# Patient Record
Sex: Male | Born: 1960 | Race: Black or African American | Hispanic: No | Marital: Single | Smoking: Former smoker
Health system: Southern US, Community
[De-identification: ages and names within clinical notes are randomized; demographics above are authoritative.]

## PROBLEM LIST (undated history)

## (undated) DIAGNOSIS — J410 Simple chronic bronchitis: Secondary | ICD-10-CM

## (undated) DIAGNOSIS — E785 Hyperlipidemia, unspecified: Secondary | ICD-10-CM

## (undated) DIAGNOSIS — R0602 Shortness of breath: Secondary | ICD-10-CM

## (undated) DIAGNOSIS — Z95810 Presence of automatic (implantable) cardiac defibrillator: Secondary | ICD-10-CM

## (undated) DIAGNOSIS — I219 Acute myocardial infarction, unspecified: Secondary | ICD-10-CM

## (undated) DIAGNOSIS — I10 Essential (primary) hypertension: Secondary | ICD-10-CM

## (undated) DIAGNOSIS — E119 Type 2 diabetes mellitus without complications: Secondary | ICD-10-CM

## (undated) DIAGNOSIS — J42 Unspecified chronic bronchitis: Secondary | ICD-10-CM

## (undated) DIAGNOSIS — J189 Pneumonia, unspecified organism: Secondary | ICD-10-CM

## (undated) DIAGNOSIS — M109 Gout, unspecified: Secondary | ICD-10-CM

## (undated) HISTORY — PX: TOE SURGERY: SHX1073

## (undated) HISTORY — PX: CARDIAC CATHETERIZATION: SHX172

## (undated) HISTORY — DX: Simple chronic bronchitis: J41.0

---

## 2001-01-30 ENCOUNTER — Inpatient Hospital Stay (HOSPITAL_COMMUNITY): Admission: EM | Admit: 2001-01-30 | Discharge: 2001-02-01 | Payer: Self-pay | Admitting: Internal Medicine

## 2001-02-25 ENCOUNTER — Inpatient Hospital Stay (HOSPITAL_COMMUNITY): Admission: EM | Admit: 2001-02-25 | Discharge: 2001-03-03 | Payer: Self-pay | Admitting: Emergency Medicine

## 2007-11-20 ENCOUNTER — Emergency Department (HOSPITAL_COMMUNITY): Admission: EM | Admit: 2007-11-20 | Discharge: 2007-11-20 | Payer: Self-pay | Admitting: Emergency Medicine

## 2009-05-01 ENCOUNTER — Emergency Department (HOSPITAL_COMMUNITY): Admission: EM | Admit: 2009-05-01 | Discharge: 2009-05-01 | Payer: Self-pay | Admitting: Emergency Medicine

## 2009-06-06 ENCOUNTER — Ambulatory Visit: Payer: Self-pay | Admitting: Vascular Surgery

## 2010-08-29 LAB — CBC
MCHC: 33.7 g/dL (ref 30.0–36.0)
RBC: 4.93 MIL/uL (ref 4.22–5.81)

## 2010-08-29 LAB — COMPREHENSIVE METABOLIC PANEL
ALT: 19 U/L (ref 0–53)
Albumin: 3.9 g/dL (ref 3.5–5.2)
Alkaline Phosphatase: 83 U/L (ref 39–117)
Chloride: 104 mEq/L (ref 96–112)
Glucose, Bld: 84 mg/dL (ref 70–99)
Potassium: 4.1 mEq/L (ref 3.5–5.1)
Sodium: 137 mEq/L (ref 135–145)
Total Bilirubin: 1 mg/dL (ref 0.3–1.2)
Total Protein: 7.6 g/dL (ref 6.0–8.3)

## 2010-08-29 LAB — DIFFERENTIAL
Basophils Relative: 1 % (ref 0–1)
Eosinophils Relative: 2 % (ref 0–5)
Lymphs Abs: 3 10*3/uL (ref 0.7–4.0)
Monocytes Absolute: 0.8 10*3/uL (ref 0.1–1.0)

## 2010-08-29 LAB — URINALYSIS, ROUTINE W REFLEX MICROSCOPIC
Glucose, UA: NEGATIVE mg/dL
Hgb urine dipstick: NEGATIVE
Ketones, ur: 15 mg/dL — AB
pH: 5.5 (ref 5.0–8.0)

## 2010-10-13 NOTE — Discharge Summary (Signed)
[place]. [facility]  Patient:    JAXDEN, BLYDEN Visit Number: [id number] MRN: [id number]          Service Type: MED Location: 2000 2018 01 Attending Physician:  Lewayne Bunting Dictated by:   Lavella Hammock, P.A. Admit Date:  01/30/2001 Discharge Date: 02/01/2001   CC:         [facility] in [place], [place]  Dr. Tomie China in [place], [place]   Referring Physician Discharge [place]  DATE OF BIRTH:  Aug 02, 1960  PROCEDURES: 1. Cardiac catheterization. 2. Coronary arteriogram. 3. Left ventriculogram. 4. PTCA and stent of one vessel.  HOSPITAL COURSE:  Mr. Phillips is a 50 year old male with a history of MI and percutaneous intervention seven months ago who went to [facility] emergency room on the night of admission for chest pain.  Patient states that he used cocaine on January 27, 2001 and developed substernal chest pain on the night before admission.  He presented to [facility] emergency room and had ST elevations with initially positive CK-MB and troponins.  He received aspirin, heparin, and nitroglycerin which caused hypotension but his pain was decreased.  He was transferred to [facility] for further evaluation and catheterization.  He went to the catheterization laboratory on January 30, 2001 and it showed a normal left main, an LAD with a 20% mid lesion, and a circumflex with a 70% lesion.  The RCA had a 40% mid lesion and then was totaled in the proximal portion.  The distal RCA had 30% and 50% stenosis.  He received PTCA and stent to the RCA, reducing the stenosis to 0 with TIMI 3 flow.  His EF was 45% with severe inferior akinesis and trace MR.  He had hypotension in response to nitroglycerin so this was discontinued. Post procedure he was otherwise stable.  He had a lipid profile checked and it showed a total cholesterol 294 with triglycerides 200, HDL 52, LDL 202.  He was started on Zocor at 40.  The patient was seen  by cardiac rehab and did well.  He was ambulatory without chest pain or shortness of breath.  Patient was counselled on cocaine and tobacco use and stated he would quit both of these but refused any outside help or assistance.  The patient had no way of getting medications filled so the hospital was providing short-term help with this and the patient is set up to go to the [facility] in [place].  The patient is considered stable for discharge on February 01, 2001.  DISCHARGE CONDITION:  Stable.  CONSULTS:  None.  COMPLICATIONS:  None.  DISCHARGE DIAGNOSES: 1. Acute inferior myocardial infarction with percutaneous transluminal    coronary angioplasty and stent to the right coronary artery this admission. 2. Family history of coronary artery disease. 3. History of tobacco use. 4. History of cocaine use. 5. Hypertension. 6. Left ventricular dysfunction with an ejection fraction of 45% by    catheterization this admission. 7. Hyperlipidemia.  DISCHARGE INSTRUCTIONS:  ACTIVITY:  His activity level is to include no driving for a week and no sexual or strenuous activity until cleared by M.D.  DIET:  He is to stick to a low fat diet.  WOUND CARE:  He is to call the office for bleeding, swelling, or drainage at the catheterization site.  SPECIAL INSTRUCTIONS:  He is not to use cocaine, tobacco, or alcohol.  FOLLOW-UP:  He is to follow up with Dr. Tomie China and see him  in two weeks.  He is to follow up at the [facility] in [place].  DISCHARGE MEDICATIONS: 1. Coated aspirin 325 mg q.d. 2. Plavix 75 mg q.d. for a month. 3. Altace 2.5 mg b.i.d. 4. Zocor 40 mg q.d. 5. Nitroglycerin 0.4 mg sublingual p.r.n. Dictated by:   Lavella Hammock, P.A. Attending Physician:  Lewayne Bunting DD:  02/01/01 TD:  02/01/01 Job: [id number]

## 2010-10-13 NOTE — Cardiovascular Report (Signed)
[place]. [facility]  Patient:    Austin [name], Austin [name] Visit Number: [id number] MRN: [id number]          Service Type: MED Location: [phone] Attending Physician:  Talitha Givens Dictated by:   Veneda Melter, M.D., [facility] Proc. Date: 03/03/01 Admit Date:  02/25/2001 Discharge Date: 03/03/2001   CC:         Glean Hess R. Revankar, M.D., Lysbeth Galas W. Ladona Ridgel, M.D. [facility]  Daisey Must, M.D. [facility]  Maisie Fus D. Riley Kill, M.D. [facility]   Cardiac Catheterization  PROCEDURE: 1. Left heart catheterization 2. Selective coronary angiography.  DIAGNOSES: 1. Two-vessel coronary artery disease. 2. Patent stents in the right coronary artery.  CARDIOLOGIST:  Veneda Melter, M.D.  HISTORY:  Austin [name] is a 50 year old black gentleman who suffered an inferior wall myocardial infarction on January 30, 2001, treated acutely with percutaneous intervention of the right coronary artery.  Unfortunately, the patient suffered reocclusion of the right coronary artery on February 25, 2001, requiring urgent repeat intervention.  He has been stabilized medically and presents now for relook angiography to assess for residual thrombus.  TECHNIQUE:  After informed consent was obtained, the patient was brought to the catheterization lab.  A 6-French sheath was placed in the right femoral artery.  Left heart catheterization and selective coronary angiography were then performed in the usual fashion using preformed 6-French Judkins catheters.  Left ventriculogram as not performed.  At the termination of the case, catheters and sheaths were removed and manual pressure applied until adequate hemostasis was achieved.  The patient tolerated the procedure well and was transferred to the floor in stable condition.  FINDINGS: 1. Left main trunk: A large caliber vessel with luminal irregularities. 2. LAD.  This is a large caliber vessel that provides a major first diagonal    branch in the  proximal segment.  The LAD system has mild irregularities    not greater than 20 to 30%. 3. Left circumflex artery: This is a medium caliber vessel that provides a    small first marginal branch at the proximal segment, a medium caliber    second marginal branch in the mid section, and a larger third marginal    branch distally.  The left circumflex system has a moderate stenosis of    60% in the mid A-V segment between the first and second marginal branches.    The remainder of the left circumflex system has mild irregularities. 4. Right coronary artery is dominant.  This is a medium caliber vessel that    provides a posterior descending artery and and a posterior ventricular    branch in its terminal segment.   The right coronary artery has    evidence of stent placement from the proximal segment to the mid section    of the vessel after the RV marginal branch.  These are all widely patent    with only mild irregularities in the mid RCA prior to the RV marginal    branch of 10%.  The distal vasculature has mild irregularities of 20 to    30%.  There is TIMI-3 flow in the right coronary artery. 5. LV: Pressure is as follows, 113/5, aortic was 113/80.  LVEDP equals 10.  ASSESSMENT/PLAN:  Austin [name] is a 50 year old gentleman who is status post inferior wall myocardial infarction with recurrent thrombosis.  The patient has patent infarct related vessel and moderate disease of the left circumflex system.  Continued medical therapy will be pursued. Dictated  by:   Veneda Melter, M.D., [facility] Attending Physician:  Talitha Givens DD:  03/03/01 TD:  03/03/01 Job: [id number]

## 2010-10-13 NOTE — Discharge Summary (Signed)
[place]. [facility]  Patient:    Austin [name], Austin [name] Visit Number: [id number] MRN: [id number]          Service Type: MED Location: [phone] Attending Physician:  Austin [name] Dictated by:   Austin [name], P.A.-C. Admit Date:  02/25/2001 Discharge Date: 03/03/2001   CC:         Austin Dubin. [name], M.D., [place], [place]   Discharge Summary  DATE OF BIRTH: 09-28-1960  HISTORY OF PRESENT ILLNESS: Austin [name] is a 50 year old black male, who presented with severe chest discomfort radiating into his back, associated with chest discomfort.  This began at 1 a.m.  It is noted that he had used cocaine at 6 p.m. the prior evening.  He tried taking nitroglycerin and his cardiac medications without relief.  He presented to [facility], where an EKG showed changes indicative of inferior myocardial infarction.  He was given Lovenox, IV nitroglycerin, aspirin, and IV Cardizem.  The patient was still having chest discomfort at the time of transfer.  PAST MEDICAL HISTORY:  1. Inferior MI, with stenting to the RCA on January 30, 2001 post cocaine     use.  2. History of hypertension.  3. Hyperlipidemia.  4. Obesity.  5. Cocaine use.  SOCIAL HISTORY: He states that he quit alcohol and smokes one pack per day.  LABORATORY DATA: At [facility] CK total was 767 with MB of 110, relative index 14.4.  Troponin 8.34.  A second CK was 968, MB 107.5, relative index 11.1, and troponin 14.73.  Subsequent CKs and troponins were declining.  Admission sodium was 137, potassium 3.6, BUN 20, creatinine 1.4, glucose 158.  H&H 12.6, 37.2, normal indices; platelets 247,000; WBC 7.8.  EKG on transfer showed normal sinus rhythm, development of inferior Q waves and inferior ST segment elevation and T wave inversion.  Subsequent EKGs essentially showed the same, with increase in Q waves.  Chest x-ray from [facility] is not on the  chart.  HOSPITAL COURSE: Austin [name] accepted Austin [name] in transfer.  He was placed on IV Integrelin, aspirin, and IV nitroglycerin.  Austin [name] felt that he should go to drug rehab post cardiac care.  He has had cocaine MI.  He was taken to the catheterization laboratory on February 25, 2001 by Dr. Riley [name].  According to Austin [name] note he had 100% proximal RCA at the prior stent site with a residual thrombus post procedure.  Angioplasty and a new stent was placed in the RCA without difficulty.  It is noted he had inferior akinesis and a 75% circumflex as well.  Dr. Riley [name] noted that he is at very high risk for re-occlusion.  He was maintained on heparin overnight, and felt he should have Integrelin.  He was maintained on heparin for another 48 hours and Integrelin overnight.  Post sheath removal and bed rest he was ambulating without difficulty.  Medications were adjusted.  Care management saw the patient and noted that he could not use the Z fund for medication because he had used that on his prior visit and that he utilizes [facility] and [facility] to assist with medications.  The patient was unreceptive to receiving info on substance abuse options on prior admission and still unreceptive at this admission.  We shall discuss with him contacting [facility] and phone numbers were provided.  Over the next several days medications were adjusted.  The patient complained of leg  discomfort on February 26, 2001 and Dr. Riley [name] ordered lower extremity Dopplers, which were negative for DVT.  On March 03, 2001 he underwent repeat cardiac catheterization for re-evaluation of his right coronary artery.  This was performed by Dr. Chales [name].  His RCA was patent with a 10% residual at the prior stent site.  He also had a 60% proximal circumflex.  Left ventriculogram was not performed.  Post sheath removal and bed rest he was ambulating without difficulty.  The  catheterization site was intact, and he was discharged home.  DISCHARGE DIAGNOSES:  1. Acute inferior myocardial infarction, associated with cocaine use, status     posterior angioplasty and stenting to restenotic right coronary artery     stent.  2. Substance abuse.  3. Noncompliance with medications.  4. History of hypertension.  5. Hyperlipidemia.  DISPOSITION: He is discharged home.  DISCHARGE MEDICATIONS:  1. Enteric-coated aspirin 325 mg q.d.  2. Plavix 75 mg q.d. for four weeks.  3. Altace 2.5 mg b.i.d.  4. Zocor 40 mg q.h.s.  5. Hydrochlorothiazide 25 mg q.d.  6. K-Dur 20 mEq q.d.  7. Sublingual nitroglycerin q.d.  He was written prescriptions for Plavix, Altace, hydrochlorothiazide, and K-Dur as he stated that he had Zocor and sublingual nitroglycerin and aspirin at home.  DISCHARGE ACTIVITY: He was advised no lifting, driving, sexual activity, heavy exertion or working until after he sees his physician.  DISCHARGE DIET: Maintain low-salt/low-fat/low-cholesterol diet.  DISCHARGE INSTRUCTIONS: He he has any problems with his catheterization site he is asked to call Austin [name] immediately.  He was told absolutely no tobacco or drug use.  FOLLOW-UP: In the morning he is asked to call Austin [name] to arrange a follow-up two week appointment. Dictated by:   Austin [name], P.A.-C. Attending Physician:  Austin [name] DD:  03/03/01 TD:  03/04/01 Job: [id number]

## 2010-10-13 NOTE — Cardiovascular Report (Signed)
[place]. [facility]  Patient:    Austin [name], Austin [name] Visit Number: [id number] MRN: [id number]          Service Type: MED Location: [facility]UA [id number] Attending Physician:  Lewayne Bunting Dictated by:   Daisey Must, M.D. [facility] Proc. Date: 01/30/01 Admit Date:  01/30/2001   CC:         Doylene Canning. Ladona Ridgel, M.D. [facility] R. Revankar, M.D.  Cardiac Catheterization Laboratory   Cardiac Catheterization  PROCEDURES PERFORMED: 1. Left heart catheterization with coronary angiography and left    ventriculography. 2. Percutaneous transluminal coronary angioplasty with AngioJet thrombectomy    followed by stent placement x2 in the proximal right coronary artery.  INDICATIONS:  The patient is a 50 year old male, with a history of cocaine abuse.  He developed the onset of chest pain approximately 9 p.m. last night. He ultimately presented to [facility] Emergency Room where ECG was consistent with an evolving inferior wall myocardial infarction and cardiac enzymes were elevated.  He was transferred emergently to [facility] for emergent cardiac catheterization.  He arrived here at approximately 7 a.m.  CATHETERIZATION PROCEDURE:  A 7 French sheath was placed in the right femoral artery, 6 French sheath in the right femoral vein.  Standard Judkins 6 French catheters were utilized.  Contrast was Omnipaque.  There were no complications.  RESULTS:  HEMODYNAMICS:  Left ventricular pressure 142/26.  Aortic pressure 142/96. There was no aortic valve gradient.  LEFT VENTRICULOGRAM:  There is severe akinesis of the inferior wall.  Ejection fraction is calculated at 45%.  There is trace mitral regurgitation.  CORONARY ARTERIOGRAPHY:  (Right dominant).  Left main:  Normal.  Left anterior descending:  The left anterior descending artery has minor luminal irregularities in the mid vessel.  The LAD gives rise to a normal sized first diagonal branch.  Left  circumflex:  The left circumflex has a tubular 70% stenosis in the mid vessel. It gives rise to a normal sized OM-1, small OM-2, and normal sized OM-3.  Right coronary artery:  The right coronary artery is a dominant vessel.  There is a 40% stenosis in the proximal vessel followed by 100% occlusion of the proximal vessel with TIMI-0 flow.  There are grade 2 left to right collaterals filling the distal right coronary artery.  IMPRESSIONS: 1. Mild to moderately decreased left ventricular systolic function secondary    to an acute inferior wall myocardial infarction. 2. Two-vessel coronary artery disease.  The culprit lesion has 100%    occlusion in the proximal right coronary artery.  There is moderate    disease in the left circumflex as described.  PLAN:  Percutaneous intervention to the right coronary artery, see below.  PERCUTANEOUS TRANSLUMINAL CORONARY ANGIOPLASTY PROCEDURE:  Following the completion of the diagnostic catheterization, we proceeded with coronary intervention.  Heparin and ReoPro were administered per protocol.  We used a 7 [place] guiding catheter.  The lesion was successfully crossed with a Hi-Torque Floppy wire and advanced to the distal vessel.  We then advanced a 3.0 x 15 mm Maverick balloon over this wire and positioned it across the lesion.  Sequential inflations were performed at 2 and then 4 atmospheres in the proximal vessel.  This did a salvage partial reperfusion into the distal vessel with evidence of significant residual thrombus.  We therefore proceeded with AngioJet thrombectomy.  Prior to proceeding with the AngioJet we placed a temporary pacemaker in the right ventricular apex.  The patient  was administered intravenous aminophylline to prevent bradycardia.  We then performed AngioJet thrombectomy with a 4 French AngioJet catheter for a total of four passes.  This resulted in removal of most of the significant visible thrombus.  We then went back  with our 3.5 x 15 mm Maverick balloon and positioned this across the lesion in the proximal vessel and inflated up to 6 atmospheres.  In the distal vessel at the acute margin was an area of haziness and a question of a filling defect.  We therefore advanced this balloon to that area and inflated it to 6 atmospheres.  We then deployed a 3.5 x 23 mm heparin-coated Bx Velocity stent in the proximal vessel at a deployment pressure of 14 atmospheres.  This stent was post-dilated with a 3.75 x 20 mm Quantum balloon inflated to 18 atmospheres in the distal stent, 20 atmospheres at the proximal edge of the stent, and then again 20 atmospheres at distal aspect of the stent.  Angiographic images at that point revealed significant improvement, however, there was a slight angiographic filling defect just proximal to the stent.  We therefore deployed a second stent.  This would not pass over the Hi-Torque Floppy wire due to angulation.  We therefore passed a Mailman wire along side the floppy wire and advanced the stent over this wire. With this, we were able to position the stent such that there was minimal overlap at the proximal edge of the previously placed stent.  The floppy wire was removed.  We then deployed this stent at a deployment pressure of 10 atmospheres.  Intermittent doses of intracoronary verapamil and nitroglycerin were administered to maintain distal perfusion.  Final angiographic images were then obtained revealing patency of the right coronary artery with 0% residual stenosis and TIMI-3 flow.  COMPLICATIONS:  None.  RESULTS:  Successful percutaneous transluminal coronary angioplasty with AngioJet thrombectomy followed by stent placement x2 in the proximal right coronary artery.  A 100% occlusion with TIMI-0 flow was reduced to 0% residual with TIMI-3 flow.   PLAN:  ReoPro will be continued for 12 hours.  Plavix will be administered for four weeks.  The patient needs aggressive  risk factor modification. Dictated by:   Daisey Must, M.D. [facility] Attending Physician:  Lewayne Bunting DD:  01/30/01 TD:  01/30/01 Job: [id number]

## 2011-05-29 DIAGNOSIS — J189 Pneumonia, unspecified organism: Secondary | ICD-10-CM

## 2011-05-29 HISTORY — DX: Pneumonia, unspecified organism: J18.9

## 2013-03-12 NOTE — H&P (Signed)
HISTORY AND PHYSICAL  Austin [name] is a 52 y.o. male patient with CC: painful teeth.  No diagnosis found.  No past medical history on file.  No current facility-administered medications for this encounter.   No current outpatient prescriptions on file.   Allergies not on file Active Problems:   * No active hospital problems. *  Vitals: There were no vitals taken for this visit. Lab results:No results found for this or any previous visit (from the past 24 hour(s)). Radiology Results: No results found. General appearance: alert, cooperative and severe distress Head: Normocephalic, without obvious abnormality, atraumatic Eyes: negative Nose: Nares normal. Septum midline. Mucosa normal. No drainage or sinus tenderness. Throat: Dental caries and periodontal disease teeth #'s 9, 10, 11, 12, 13, 14, 15, 18, 20, 21, 22, 23, 24, 25, 26, 27 Neck: no adenopathy, supple, symmetrical, trachea midline and thyroid not enlarged, symmetric, no tenderness/mass/nodules Resp: clear to auscultation bilaterally Cardio: regular rate and rhythm, S1, S2 normal, no murmur, click, rub or gallop  Assessment:52 BM HTN, Pacemaker, NIDDM, Obesity with nonrestorable teeth #'s 9, 10, 11, 12, 13, 14, 15, 18, 20, 21, 22, 23, 24, 25, 26, 27.  Plan: Extraction teeth #'s 9, 10, 11, 12, 13, 14, 15, 18, 20, 21, 22, 23, 24, 25, 26, 27, alveoloplasty. General Anesthesia. Day surgery.   Nelly Scriven M 03/12/2013

## 2013-03-13 ENCOUNTER — Encounter (HOSPITAL_COMMUNITY): Payer: Self-pay

## 2013-03-13 ENCOUNTER — Encounter (HOSPITAL_COMMUNITY)
Admission: RE | Admit: 2013-03-13 | Discharge: 2013-03-13 | Disposition: A | Discharge status: Home / Self Care | Payer: Medicaid Other | Source: Ambulatory Visit | Attending: Anesthesiology | Admitting: Anesthesiology

## 2013-03-13 ENCOUNTER — Encounter (HOSPITAL_COMMUNITY)
Admission: RE | Admit: 2013-03-13 | Discharge: 2013-03-13 | Disposition: A | Discharge status: Home / Self Care | Payer: Medicaid Other | Source: Ambulatory Visit | Attending: Oral Surgery | Admitting: Oral Surgery

## 2013-03-13 HISTORY — DX: Essential (primary) hypertension: I10

## 2013-03-13 HISTORY — DX: Presence of automatic (implantable) cardiac defibrillator: Z95.810

## 2013-03-13 HISTORY — DX: Pneumonia, unspecified organism: J18.9

## 2013-03-13 HISTORY — DX: Unspecified chronic bronchitis: J42

## 2013-03-13 HISTORY — DX: Shortness of breath: R06.02

## 2013-03-13 HISTORY — DX: Acute myocardial infarction, unspecified: I21.9

## 2013-03-13 HISTORY — DX: Gout, unspecified: M10.9

## 2013-03-13 HISTORY — DX: Hyperlipidemia, unspecified: E78.5

## 2013-03-13 HISTORY — DX: Type 2 diabetes mellitus without complications: E11.9

## 2013-03-13 LAB — BASIC METABOLIC PANEL
BUN: 25 mg/dL — ABNORMAL HIGH (ref 6–23)
CO2: 26 mEq/L (ref 19–32)
Calcium: 10.1 mg/dL (ref 8.4–10.5)
GFR calc non Af Amer: 50 mL/min — ABNORMAL LOW (ref >90)
Glucose, Bld: 178 mg/dL — ABNORMAL HIGH (ref 70–99)
Potassium: 4.8 mEq/L (ref 3.5–5.1)

## 2013-03-13 LAB — CBC
Hemoglobin: 14 g/dL (ref 13.0–17.0)
MCH: 29.5 pg (ref 26.0–34.0)
MCHC: 32.8 g/dL (ref 30.0–36.0)
MCV: 89.9 fL (ref 78.0–100.0)
RDW: 14.6 % (ref 11.5–15.5)

## 2013-03-13 NOTE — Progress Notes (Signed)
Anesthesia Chart Review:  Patient is a 52 year old male scheduled for dental extractions on 03/16/13 by Dr. Barbette Merino.  His PAT visit was on Friday 03/13/13.  History includes morbid obesity, CAD/inferior wall MI s/p PTCA, thrombectomy, and stent X 2 to RCA 01/30/01 and additional stents in 2004 in [place], ischemic cardiomyopathy, s/p single chamber Biotronik AICD (02/19/12 by Dr. Rudolpho Sevin), HTN, DM2, HLD, gout, PNA '13, chronic bronchitis, former smoker, chronic DOE. PCP is Dr. Celine Mans in [place].    Meds include allopurinol, ASA, colchicine, Advair, Lasix, hydralazine, Levemir, lisinopril, metoprolol, oxycodone, prednisone, simvastatin, spironolactone.  Primary cardiologist is Dr. Gypsy Balsam, last visit 04/03/12.  EP cardiologist is Dr. Rudolpho Sevin.  He was seen by Dr. Jerrell Mylar PA in 10/2012, and looks like his last device check was on 12/09/12.  Nuclear stress test on 11/22/11 [facility]) showed no ischemia, cardiomyopathy with EF of 22%.  Echo on 01/16/12 ([facility]) showed severely dilated LV, severe, systolic dysfunction, inferior wall severe hypokinesis, anterior wall mild hypokinesis, but apical segment severe hypokinesis.  Abnormal diastolic function (pseudonormal).  Normal RV size and function. Moderately dilated LA. Mild MR. Normal aortic root.    EKG on 03/13/13 showed NSR, inferior infarct (age undetermined).  Overall, it appears stable when compared to his EKG from [facility] on 11/18/11.  CXR on 03/13/13 showed cardiomegaly, no acute cardiopulmonary process.  Preoperative labs noted.  BUN/Cr 25/1.53.  Glucose 178.  CBC WNL.  Above reviewed with anesthesiologist Dr. Katrinka Blazing.  He did not anticipate cautery use by Dr. Barbette Merino. (Dr. Jerrell Mylar office would not sign form, but did provide the manufacturer name--Biotronik--and manufacturer number--[phone].  Form also stated that patient was not pacer dependent.) Because patient has had cardiology follow-up within the past  year, device interrogation within the past six months, stress/echo within the past two years, it is anticipated that he can proceed if no new CV/CHF symptoms.  He will be further evaluated by his assigned anesthesiologist on the day of surgery.  Velna [facility] Short Stay Center/Anesthesiology Phone [phone] 03/13/2013 4:23 PM

## 2013-03-13 NOTE — Pre-Procedure Instructions (Signed)
HAJI DELAINE  03/13/2013   Your procedure is scheduled on:  Monday March 16, 2013.  Report to [facility] Short Stay Entrance "A" at 6:30 AM.  Call this number if you have problems the morning of surgery: [phone]   Remember:   Do not eat food or drink liquids after midnight.   Take these medicines the morning of surgery with A SIP OF WATER: Allopurinol (Zyloprim), Advair inhaler, Hydralazine (Apresoline), Isosorbide (Imdur), Metoprolol (Lopressor), Oxycodone (Precocet) if needed for pain.  Do NOT take any diabetic medications the morning of your surgery    Do not wear jewelry.  Do not wear lotions, powders, or colognes. You may wear deodorant.  Men may shave face and neck.  Do not bring valuables to the hospital.  [facility] is not responsible for any belongings or valuables.               Contacts, dentures or bridgework may not be worn into surgery.  Leave suitcase in the car. After surgery it may be brought to your room.  For patients admitted to the hospital, discharge time is determined by your treatment team.               Patients discharged the day of surgery will not be allowed to drive home.  Name and phone number of your driver: Family/Friend  Special Instructions: Shower using CHG 2 nights before surgery and the night before surgery.  If you shower the day of surgery use CHG.  Use special wash - you have one bottle of CHG for all showers.  You should use approximately 1/3 of the bottle for each shower.   Please read over the following fact sheets that you were given: Pain Booklet, Coughing and Deep Breathing and Surgical Site Infection Prevention

## 2013-03-13 NOTE — Progress Notes (Addendum)
Patient informed Nurse that he had a MI in 2002 and had two stents placed. One stent was placed at [facility] and the other was placed shortly thereafter at [facility]. Patient has a ICD by [employer] and perioperative implanted cardiac device programming sheet was faxed to Dr. Sandy Salaam at [facility] [place] office # [phone] and fax [phone]. Cardiologist is Dr. Gypsy Balsam also at [facility] and LOV was this year in 2014. PCP Is Dr. Celine Mans at [facility] in [place] office # [phone] fax [phone].. Will request records. Sleep apnea results positive and will be faxed to PCP.

## 2013-03-13 NOTE — Progress Notes (Signed)
03/13/13 0959  OBSTRUCTIVE SLEEP APNEA  Have you ever been diagnosed with sleep apnea through a sleep study? No  Do you snore loudly (loud enough to be heard through closed doors)?  1  Do you often feel tired, fatigued, or sleepy during the daytime? 1  Has anyone observed you stop breathing during your sleep? 0  Do you have, or are you being treated for high blood pressure? 1  BMI more than 35 kg/m2? 1  Age over 53 years old? 1  Neck circumference greater than 40 cm/18 inches? 1  Gender: 1  Obstructive Sleep Apnea Score 7  Score 4 or greater  Results sent to PCP   This patient has screened at risk for sleep apnea using the STOP Bang tool during a pre-surgical visit. A score of 4 or greater is at risk for sleep apnea.

## 2013-03-15 MED ORDER — DEXTROSE 5 % IV SOLN
3.0000 g | INTRAVENOUS | Status: AC
  Administered 2013-03-16: 3 g via INTRAVENOUS
  Filled 2013-03-15: qty 3000

## 2013-03-16 ENCOUNTER — Encounter (HOSPITAL_COMMUNITY): Payer: Medicaid Other | Admitting: Vascular Surgery

## 2013-03-16 ENCOUNTER — Ambulatory Visit (HOSPITAL_COMMUNITY)
Admission: RE | Admit: 2013-03-16 | Discharge: 2013-03-16 | Disposition: A | Discharge status: Home / Self Care | Payer: Medicaid Other | Source: Ambulatory Visit | Attending: Oral Surgery | Admitting: Oral Surgery

## 2013-03-16 ENCOUNTER — Encounter (HOSPITAL_COMMUNITY)
Admission: RE | Disposition: A | Discharge status: Home / Self Care | Payer: Self-pay | Source: Ambulatory Visit | Attending: Oral Surgery

## 2013-03-16 ENCOUNTER — Encounter (HOSPITAL_COMMUNITY): Payer: Self-pay | Admitting: *Deleted

## 2013-03-16 ENCOUNTER — Ambulatory Visit (HOSPITAL_COMMUNITY): Payer: Medicaid Other | Admitting: Anesthesiology

## 2013-03-16 DIAGNOSIS — Z01810 Encounter for preprocedural cardiovascular examination: Secondary | ICD-10-CM | POA: Insufficient documentation

## 2013-03-16 DIAGNOSIS — I10 Essential (primary) hypertension: Secondary | ICD-10-CM | POA: Insufficient documentation

## 2013-03-16 DIAGNOSIS — K089 Disorder of teeth and supporting structures, unspecified: Secondary | ICD-10-CM | POA: Insufficient documentation

## 2013-03-16 DIAGNOSIS — Z950 Presence of cardiac pacemaker: Secondary | ICD-10-CM | POA: Insufficient documentation

## 2013-03-16 DIAGNOSIS — K029 Dental caries, unspecified: Secondary | ICD-10-CM

## 2013-03-16 DIAGNOSIS — K056 Periodontal disease, unspecified: Secondary | ICD-10-CM

## 2013-03-16 DIAGNOSIS — Z01812 Encounter for preprocedural laboratory examination: Secondary | ICD-10-CM | POA: Insufficient documentation

## 2013-03-16 DIAGNOSIS — E119 Type 2 diabetes mellitus without complications: Secondary | ICD-10-CM | POA: Insufficient documentation

## 2013-03-16 DIAGNOSIS — K011 Impacted teeth: Secondary | ICD-10-CM

## 2013-03-16 DIAGNOSIS — Z01818 Encounter for other preprocedural examination: Secondary | ICD-10-CM | POA: Insufficient documentation

## 2013-03-16 DIAGNOSIS — IMO0002 Reserved for concepts with insufficient information to code with codable children: Secondary | ICD-10-CM | POA: Insufficient documentation

## 2013-03-16 HISTORY — PX: MULTIPLE EXTRACTIONS WITH ALVEOLOPLASTY: SHX5342

## 2013-03-16 LAB — GLUCOSE, CAPILLARY: Glucose-Capillary: 151 mg/dL — ABNORMAL HIGH (ref 70–99)

## 2013-03-16 SURGERY — MULTIPLE EXTRACTION WITH ALVEOLOPLASTY
Anesthesia: General | Site: Mouth | Wound class: Clean Contaminated

## 2013-03-16 MED ORDER — OXYCODONE HCL 5 MG PO TABS
5.0000 mg | ORAL_TABLET | Freq: Once | ORAL | Status: AC | PRN
  Administered 2013-03-16: 5 mg via ORAL

## 2013-03-16 MED ORDER — FENTANYL CITRATE 0.05 MG/ML IJ SOLN
INTRAMUSCULAR | Status: DC | PRN
  Administered 2013-03-16: 100 ug via INTRAVENOUS

## 2013-03-16 MED ORDER — PROMETHAZINE HCL 25 MG/ML IJ SOLN: 6.2500 mg | INTRAMUSCULAR | Status: DC | PRN

## 2013-03-16 MED ORDER — AMOXICILLIN 500 MG PO CAPS: 500.0000 mg | ORAL_CAPSULE | Freq: Four times a day (QID) | ORAL | Status: DC

## 2013-03-16 MED ORDER — LACTATED RINGERS IV SOLN
INTRAVENOUS | Status: DC
  Administered 2013-03-16 (×3): via INTRAVENOUS

## 2013-03-16 MED ORDER — OXYCODONE-ACETAMINOPHEN 10-325 MG PO TABS: 1.0000 | ORAL_TABLET | ORAL | Status: DC | PRN

## 2013-03-16 MED ORDER — MIDAZOLAM HCL 5 MG/5ML IJ SOLN
INTRAMUSCULAR | Status: DC | PRN
  Administered 2013-03-16: 2 mg via INTRAVENOUS

## 2013-03-16 MED ORDER — OXYCODONE HCL 5 MG/5ML PO SOLN: 5.0000 mg | Freq: Once | ORAL | Status: AC | PRN

## 2013-03-16 MED ORDER — SODIUM CHLORIDE 0.9 % IR SOLN
Status: DC | PRN
  Administered 2013-03-16: 1000 mL

## 2013-03-16 MED ORDER — LIDOCAINE-EPINEPHRINE 2 %-1:100000 IJ SOLN
INTRAMUSCULAR | Status: DC | PRN
  Administered 2013-03-16: 19 mL

## 2013-03-16 MED ORDER — OXYCODONE HCL 5 MG PO TABS
ORAL_TABLET | ORAL | Status: AC
  Filled 2013-03-16: qty 1

## 2013-03-16 MED ORDER — HYDROMORPHONE HCL PF 1 MG/ML IJ SOLN: 0.2500 mg | INTRAMUSCULAR | Status: DC | PRN

## 2013-03-16 MED ORDER — PROPOFOL 10 MG/ML IV BOLUS
INTRAVENOUS | Status: DC | PRN
  Administered 2013-03-16: 100 mg via INTRAVENOUS

## 2013-03-16 MED ORDER — LIDOCAINE HCL (CARDIAC) 20 MG/ML IV SOLN
INTRAVENOUS | Status: DC | PRN
  Administered 2013-03-16: 100 mg via INTRAVENOUS

## 2013-03-16 MED ORDER — SUCCINYLCHOLINE CHLORIDE 20 MG/ML IJ SOLN
INTRAMUSCULAR | Status: DC | PRN
  Administered 2013-03-16: 120 mg via INTRAVENOUS

## 2013-03-16 MED ORDER — PHENYLEPHRINE HCL 10 MG/ML IJ SOLN
INTRAMUSCULAR | Status: DC | PRN
  Administered 2013-03-16 (×2): 120 ug via INTRAVENOUS

## 2013-03-16 MED ORDER — LIDOCAINE-EPINEPHRINE 2 %-1:100000 IJ SOLN
INTRAMUSCULAR | Status: AC
  Filled 2013-03-16: qty 1

## 2013-03-16 SURGICAL SUPPLY — 28 items
BUR CROSS CUT FISSURE 1.6 (BURR) ×2 IMPLANT
BUR EGG ELITE 4.0 (BURR) ×1 IMPLANT
CANISTER SUCTION 2500CC (MISCELLANEOUS) ×2 IMPLANT
CLOTH BEACON ORANGE TIMEOUT ST (SAFETY) ×1 IMPLANT
COVER SURGICAL LIGHT HANDLE (MISCELLANEOUS) ×2 IMPLANT
CRADLE DONUT ADULT HEAD (MISCELLANEOUS) ×2 IMPLANT
DECANTER SPIKE VIAL GLASS SM (MISCELLANEOUS) ×2 IMPLANT
GAUZE PACKING FOLDED 2  STR (GAUZE/BANDAGES/DRESSINGS) ×1
GAUZE PACKING FOLDED 2 STR (GAUZE/BANDAGES/DRESSINGS) ×1 IMPLANT
GLOVE BIO SURGEON STRL SZ 6.5 (GLOVE) ×2 IMPLANT
GLOVE BIO SURGEON STRL SZ7.5 (GLOVE) ×2 IMPLANT
GLOVE BIOGEL PI IND STRL 7.0 (GLOVE) ×1 IMPLANT
GLOVE BIOGEL PI INDICATOR 7.0 (GLOVE) ×1
GOWN STRL NON-REIN LRG LVL3 (GOWN DISPOSABLE) ×2 IMPLANT
GOWN STRL REIN XL XLG (GOWN DISPOSABLE) ×2 IMPLANT
KIT BASIN OR (CUSTOM PROCEDURE TRAY) ×2 IMPLANT
KIT ROOM TURNOVER OR (KITS) ×2 IMPLANT
NEEDLE 22X1 1/2 (OR ONLY) (NEEDLE) ×2 IMPLANT
NS IRRIG 1000ML POUR BTL (IV SOLUTION) ×2 IMPLANT
PAD ARMBOARD 7.5X6 YLW CONV (MISCELLANEOUS) ×4 IMPLANT
SUT CHROMIC 3 0 PS 2 (SUTURE) ×3 IMPLANT
SUT ETHILON 6 0 P 1 (SUTURE) ×1 IMPLANT
SYR CONTROL 10ML LL (SYRINGE) ×2 IMPLANT
TOWEL OR 17X26 10 PK STRL BLUE (TOWEL DISPOSABLE) ×2 IMPLANT
TRAY ENT MC OR (CUSTOM PROCEDURE TRAY) ×2 IMPLANT
TUBING IRRIGATION (MISCELLANEOUS) ×1 IMPLANT
WATER STERILE IRR 1000ML POUR (IV SOLUTION) IMPLANT
YANKAUER SUCT BULB TIP NO VENT (SUCTIONS) ×2 IMPLANT

## 2013-03-16 NOTE — Anesthesia Procedure Notes (Addendum)
Procedure Name: Intubation Date/Time: 03/16/2013 9:53 AM Performed by: Whitman Hero Pre-anesthesia Checklist: Patient identified, Timeout performed, Emergency Drugs available, Suction available and Patient being monitored Patient Re-evaluated:Patient Re-evaluated prior to inductionOxygen Delivery Method: Circle system utilized Preoxygenation: Pre-oxygenation with 100% oxygen Intubation Type: IV induction Ventilation: Mask ventilation without difficulty Laryngoscope Size: Mac and 4 Grade View: Grade II Tube type: Oral Tube size: 7.5 mm Number of attempts: 1 Airway Equipment and Method: Patient positioned with wedge pillow Secured at: 23 cm Tube secured with: Tape Dental Injury: Teeth and Oropharynx as per pre-operative assessment

## 2013-03-16 NOTE — Transfer of Care (Signed)
Immediate Anesthesia Transfer of Care Note  Patient: Austin [name]  Procedure(s) Performed: Procedure(s): MULTIPLE EXTRACTION (#9, 10, 11, 12, 13, 14, 15, 18, 20, 21, 22, 23, 24, 25, 26, 27) WITH ALVEOLOPLASTY (N/A)  Patient Location: PACU  Anesthesia Type:General  Level of Consciousness: awake and oriented  Airway & Oxygen Therapy: Patient Spontanous Breathing and Patient connected to nasal cannula oxygen  Post-op Assessment: Report given to PACU RN and Post -op Vital signs reviewed and stable  Post vital signs: Reviewed and stable  Complications: No apparent anesthesia complications

## 2013-03-16 NOTE — Progress Notes (Signed)
Report given to robin rn as caregiver 

## 2013-03-16 NOTE — Anesthesia Postprocedure Evaluation (Signed)
Anesthesia Post Note  Patient: Austin [name]  Procedure(s) Performed: Procedure(s) (LRB): MULTIPLE EXTRACTION (#9, 10, 11, 12, 13, 14, 15, 18, 20, 21, 22, 23, 24, 25, 26, 27) WITH ALVEOLOPLASTY (N/A)  Anesthesia type: general  Patient location: PACU  Post pain: Pain level controlled  Post assessment: Patient's Cardiovascular Status Stable  Last Vitals:  Filed Vitals:   03/16/13 1221  BP: 150/84  Pulse: 78  Temp:   Resp:     Post vital signs: Reviewed and stable  Level of consciousness: sedated  Complications: No apparent anesthesia complications

## 2013-03-16 NOTE — Progress Notes (Signed)
Pt having elevated BP, cuff changed to large cuff.  BP better afterwards

## 2013-03-16 NOTE — Op Note (Signed)
03/16/2013  10:38 AM  PATIENT:  [name]  52 y.o. male  PRE-OPERATIVE DIAGNOSIS:  NON-RESTORABLE TEETH #9, 10, 11, 12, 13, 14, 15, 18, 20, 21, 22, 23, 24, 25, 26, 27  POST-OPERATIVE DIAGNOSIS:  SAME  PROCEDURE:  Procedure(s): MULTIPLE EXTRACTION (#9, 10, 11, 12, 13, 14, 15, 18, 20, 21, 22, 23, 24, 25, 26, 27) WITH ALVEOLOPLASTY  SURGEON:  Surgeon(s): Georgia Lopes, DDS  ANESTHESIA:   local and general  EBL:  minimal  DRAINS: none   SPECIMEN:  No Specimen  COMPLICATIONS; 3mm laceration to left upper lip  COUNTS:  YES  PLAN OF CARE: Discharge to home after PACU  PATIENT DISPOSITION:  PACU - hemodynamically stable.   PROCEDURE DETAILS: Dictation #[id number]  Georgia Lopes, DMD 03/16/2013 10:38 AM

## 2013-03-16 NOTE — Op Note (Signed)
NAME:  Austin [name], Austin [name] NO.:  [id number]  MEDICAL RECORD NO.:  [id number]  LOCATION:  [facility]                         FACILITY:  [facility]  PHYSICIAN:  Georgia Lopes, M.D.  DATE OF BIRTH:  [birth year]  DATE OF PROCEDURE:  03/16/2013 DATE OF DISCHARGE:  03/16/2013                              OPERATIVE REPORT   PREOPERATIVE DIAGNOSIS:  Nonrestorable teeth numbers 9, 10, 11, 12, 13, 14, 15, 18, 20, 21, 22, 23, 24, 25, 26, 27.  POSTOPERATIVE DIAGNOSIS:  Nonrestorable teeth numbers 9, 10, 11, 12, 13, 14, 15, 18, 20, 21, 22, 23, 24, 25, 26, 27 plus root of tooth #16.  PROCEDURE:  Multiple extractions teeth #9, 10, 11, 12, 13, 14, 15, 16, 18 20, 21, 22, 23, 24, 25, 26, 27, alveoplasty, left maxilla and mandible.  SURGEON:  Georgia Lopes, MD  ANESTHESIA:  General, Quita Skye. Krista Blue, MD, attending nasal intubation.  INDICATIONS FOR PROCEDURE:  Austin [name] is a 52 year old male with significant past medical history of hypertension, pacemaker, diabetes, morbid obesity, possible sleep apnea, who was referred by his general dentist for removal of remaining teeth because of the need for adequate anesthesia and airway protection, as well as patient's safety was recommended that this case be done at the hospital for airway protection could be provided by intubation.  DESCRIPTION OF PROCEDURE:  The patient was taken to the operating room, placed on the table in supine position.  General anesthesia was administered intravenously and an oral endotracheal tube was placed and marked.  The eyes were protected.  The patient was draped for the procedure.  A time-out was performed.  The posterior pharynx was suctioned.  A throat pack was placed.  A 2% lidocaine with 1:100,000 epinephrine was infiltrated in inferior alveolar block on the right and left side and buccal and palatal infiltration in the maxilla, total of 19 mL was utilized.  A 15 blade was used make a full-thickness  incision beginning in the left mandible with tooth #18 and carried forward to tooth #27, both buccally and lingually in the mandible.  Then in the maxilla, a 15 blade was used to make an incision at tooth #15 and carried forward to tooth #9, both bucally and palatally.  The periosteum was then reflected in the maxilla and mandible, and the left side was operated first on the lower aspect.  Tooth #18 was elevated with a 301 elevator and removed with the Universal forceps.  Tooth #20 was a simple extraction removed with the Asch forceps.  Tooth #21 was the residual root which required removal of bone with the Stryker handpiece under irrigation with a 701 fissure bur.  Teeth numbers 22, 23, 24, 25, 25, 26, and 27, all were simple extractions and were performed with the 301 elevator and the Asch forceps.  The lower sockets were then curetted and irrigated.  The periosteum was reflected to expose the alveolar crest and then the bone bur was used to perform the alveoplasty.  The bone file was also used for the alveoplasty in the left and right mandible. Then, the area was irrigated and closed with 3-0 chromic.  Attention was turned to the left maxilla.  The teeth were elevated with a 301 elevator and then removed from the mouth with the upper universal forceps.  Tooth #14 fractured leaving a palatal root.  Tooth #11 was complete bony impacted and required additional bone removal for removal of this tooth. When the tooth was removed, the alveolar plate fractured and was recontoured and placed back in the mouth.  Then it was noted that there was a root fragment in the area of tooth #16.  Bone was removed from around this and then the tooth was removed with the rongeur.  The periosteum was further reflected to expose the alveolar crest and then the alveoplasty was performed.  Bone shavings and bone removed from sharp inner proximal areas were replaced in the maxilla to help provide adequate bone  support, so that a denture could be stable.  The bone graft having been done, then the area was closed with 3-0 chromic continuous interlocking sutures.  It was noted at this point that there was a 3-mm laceration at the vermilion border of the upper lip.  This was closed with one 6-0 nylon.  The endotracheal tube was then repositioned to the other side of the mouth and there was no additional work to be done on the right side.  The oral cavity was then irrigated, suctioned.  Throat pack was removed.  The patient was awakened, extubated, taken to the recovery room, breathing spontaneously good condition.  ESTIMATED BLOOD LOSS:  Minimum.  COMPLICATIONS:  Small laceration, left lip sutured primarily.  SPECIMENS:  None.     Georgia Lopes, M.D.     SMJ/MEDQ  D:  03/16/2013  T:  03/16/2013  Job:  [id number]

## 2013-03-16 NOTE — H&P (Signed)
H&P documentation  -History and Physical Reviewed  -Patient has been re-examined  -No change in the plan of care  Austin [name] M  

## 2013-03-16 NOTE — Anesthesia Preprocedure Evaluation (Addendum)
Anesthesia Evaluation  Patient identified by MRN, date of birth, ID band Patient awake    Reviewed: Allergy & Precautions, H&P , NPO status , Patient's Chart, lab work & pertinent test results  History of Anesthesia Complications Negative for: history of anesthetic complications  Airway       Dental   Pulmonary shortness of breath, pneumonia -, resolved, former smoker,          Cardiovascular hypertension, Pt. on medications and Pt. 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  Echo on 01/16/12 ([facility]) showed severely dilated LV, severe, systolic dysfunction, inferior wall severe hypokinesis, anterior wall mild hypokinesis, but apical segment severe hypokinesis.  Abnormal diastolic function (pseudonormal).  Normal RV size and function. Moderately dilated LA. Mild MR. Normal aortic root.      Neuro/Psych negative neurological ROS     GI/Hepatic negative GI ROS, Neg liver ROS,   Endo/Other  diabetes, Insulin Dependent and Oral Hypoglycemic AgentsMorbid obesity  Renal/GU negative Renal ROS     Musculoskeletal   Abdominal   Peds  Hematology   Anesthesia Other Findings   Reproductive/Obstetrics                          Anesthesia Physical Anesthesia Plan  ASA: III  Anesthesia Plan: General   Post-op Pain Management:    Induction: Intravenous  Airway Management Planned: Oral ETT  Additional Equipment:   Intra-op Plan:   Post-operative Plan: Extubation in OR  Informed Consent: I have reviewed the patients History and Physical, chart, labs and discussed the procedure including the risks, benefits and alternatives for the proposed anesthesia with the patient or authorized representative who has indicated his/her understanding and acceptance.   Dental advisory given  Plan  Discussed with: CRNA, Anesthesiologist and Surgeon  Anesthesia Plan Comments:        Anesthesia Quick Evaluation

## 2013-03-17 ENCOUNTER — Encounter (HOSPITAL_COMMUNITY): Payer: Self-pay | Admitting: Oral Surgery

## 2013-05-05 ENCOUNTER — Emergency Department (HOSPITAL_COMMUNITY)
Admission: EM | Admit: 2013-05-05 | Discharge: 2013-05-05 | Disposition: A | Discharge status: Home / Self Care | Payer: Medicaid Other | Attending: Emergency Medicine | Admitting: Emergency Medicine

## 2013-05-05 DIAGNOSIS — M5431 Sciatica, right side: Secondary | ICD-10-CM

## 2013-05-05 DIAGNOSIS — Z8709 Personal history of other diseases of the respiratory system: Secondary | ICD-10-CM | POA: Insufficient documentation

## 2013-05-05 DIAGNOSIS — Z8701 Personal history of pneumonia (recurrent): Secondary | ICD-10-CM | POA: Insufficient documentation

## 2013-05-05 DIAGNOSIS — Z95810 Presence of automatic (implantable) cardiac defibrillator: Secondary | ICD-10-CM | POA: Insufficient documentation

## 2013-05-05 DIAGNOSIS — M109 Gout, unspecified: Secondary | ICD-10-CM | POA: Insufficient documentation

## 2013-05-05 DIAGNOSIS — Z79899 Other long term (current) drug therapy: Secondary | ICD-10-CM | POA: Insufficient documentation

## 2013-05-05 DIAGNOSIS — E119 Type 2 diabetes mellitus without complications: Secondary | ICD-10-CM | POA: Insufficient documentation

## 2013-05-05 DIAGNOSIS — IMO0002 Reserved for concepts with insufficient information to code with codable children: Secondary | ICD-10-CM | POA: Insufficient documentation

## 2013-05-05 DIAGNOSIS — Z7982 Long term (current) use of aspirin: Secondary | ICD-10-CM | POA: Insufficient documentation

## 2013-05-05 DIAGNOSIS — I10 Essential (primary) hypertension: Secondary | ICD-10-CM | POA: Insufficient documentation

## 2013-05-05 DIAGNOSIS — I252 Old myocardial infarction: Secondary | ICD-10-CM | POA: Insufficient documentation

## 2013-05-05 DIAGNOSIS — Z794 Long term (current) use of insulin: Secondary | ICD-10-CM | POA: Insufficient documentation

## 2013-05-05 DIAGNOSIS — Z95818 Presence of other cardiac implants and grafts: Secondary | ICD-10-CM | POA: Insufficient documentation

## 2013-05-05 DIAGNOSIS — M5430 Sciatica, unspecified side: Secondary | ICD-10-CM | POA: Insufficient documentation

## 2013-05-05 DIAGNOSIS — E785 Hyperlipidemia, unspecified: Secondary | ICD-10-CM | POA: Insufficient documentation

## 2013-05-05 DIAGNOSIS — Z87891 Personal history of nicotine dependence: Secondary | ICD-10-CM | POA: Insufficient documentation

## 2013-05-05 MED ORDER — MORPHINE SULFATE 4 MG/ML IJ SOLN
4.0000 mg | Freq: Once | INTRAMUSCULAR | Status: AC
  Administered 2013-05-05: 4 mg via INTRAMUSCULAR
  Filled 2013-05-05: qty 1

## 2013-05-05 NOTE — ED Notes (Signed)
PT ambulated with baseline gait; VSS; A&Ox3; no signs of distress; respirations even and unlabored; skin warm and dry; no questions upon discharge.  

## 2013-05-05 NOTE — ED Notes (Signed)
lower back pain that radiates into his rt. Thigh. Denies any injuries.  Pt. Was at [facility] ,  Medication is not helping.

## 2013-05-05 NOTE — ED Provider Notes (Signed)
CSN: [id number]     Arrival date & time 05/05/13  1745 History   First MD Initiated Contact with Patient 05/05/13 1953     Chief Complaint  Patient presents with  . Back Pain   (Consider location/radiation/quality/duration/timing/severity/associated sxs/prior Treatment) HPI Pt is a 52yo male with hx of DM, gout, HTN, MI with internal defibrillator c/o right lower back pain that radiates into right lateral thigh.  Pt states pain started on Saturday, 12/6.  Pain is constant, aching and sharp, 10/10, worse with activity. Denies recent falls or other trauma. Denies numbness or tingling in legs or groin.  Pt was seen at [facility] on Saturday for same where a CT abdomen was performed.  Pt was given percocet, prednisone, and flexeril but pt states he has had not relief with medication. Denies fever, n/v/d. Denies hx of cancer or IVDU.  Past Medical History  Diagnosis Date  . Automatic implantable cardioverter-defibrillator in situ   . Hypertension   . Myocardial infarction   . Shortness of breath     with exertion  . Pneumonia 2013  . Diabetes mellitus without complication     takes insulin  . Hyperlipemia   . Gout   . Chronic bronchitis     Take advair inhaler   Past Surgical History  Procedure Laterality Date  . Cardiac catheterization      stent x 2  . Toe surgery Right     pinky toe  . Multiple extractions with alveoloplasty N/A 03/16/2013    Procedure: MULTIPLE EXTRACTION (#9, 10, 11, 12, 13, 14, 15, 18, 20, 21, 22, 23, 24, 25, 26, 27) WITH ALVEOLOPLASTY;  Surgeon: Georgia Lopes, DDS;  Location: [facility] OR;  Service: Oral Surgery;  Laterality: N/A;   No family history on file. History  Substance Use Topics  . Smoking status: Former Games developer  . Smokeless tobacco: Not on file  . Alcohol Use: 1.2 oz/week    2 Cans of beer per week     Comment: occasional    Review of Systems  Constitutional: Negative for fever and chills.  Respiratory: Negative for shortness of breath.     Cardiovascular: Negative for chest pain.  Gastrointestinal: Negative for nausea, vomiting, abdominal pain and diarrhea.  Genitourinary: Negative for dysuria, flank pain, decreased urine volume and difficulty urinating.  Musculoskeletal: Positive for back pain and myalgias. Negative for neck pain and neck stiffness.  All other systems reviewed and are negative.    Allergies  Review of patient's allergies indicates no known allergies.  Home Medications   Current Outpatient Rx  Name  Route  Sig  Dispense  Refill  . allopurinol (ZYLOPRIM) 300 MG tablet   Oral   Take 300 mg by mouth daily.         Marland Kitchen aspirin 325 MG tablet   Oral   Take 325 mg by mouth daily.         . colchicine (COLCRYS) 0.6 MG tablet   Oral   Take 0.6 mg by mouth See admin instructions. Take two tablets for 1 dose, then 1 tablet 1 hour later for acute flare. Max 3 per day         . cyclobenzaprine (FLEXERIL) 10 MG tablet   Oral   Take 10 mg by mouth 3 (three) times daily as needed for muscle spasms.         . Fluticasone-Salmeterol (ADVAIR) 100-50 MCG/DOSE AEPB   Inhalation   Inhale 1 puff into the lungs every 12 (twelve)  hours.         . furosemide (LASIX) 20 MG tablet   Oral   Take 20 mg by mouth daily.         . hydrALAZINE (APRESOLINE) 25 MG tablet   Oral   Take 37.5 mg by mouth 3 (three) times daily.         . insulin detemir (LEVEMIR) 100 UNIT/ML injection   Subcutaneous   Inject 10 Units into the skin daily.         . isosorbide mononitrate (IMDUR) 30 MG 24 hr tablet   Oral   Take 30 mg by mouth daily.         Marland Kitchen lisinopril (PRINIVIL,ZESTRIL) 20 MG tablet   Oral   Take 20 mg by mouth daily.         . metoprolol (LOPRESSOR) 100 MG tablet   Oral   Take 100 mg by mouth daily.         Marland Kitchen oxyCODONE-acetaminophen (PERCOCET) 10-325 MG per tablet   Oral   Take 1-2 tablets by mouth every 4 (four) hours as needed for pain.   40 tablet   0   . predniSONE (DELTASONE) 20 MG  tablet   Oral   Take 40 mg by mouth daily.         . simvastatin (ZOCOR) 40 MG tablet   Oral   Take 40 mg by mouth daily.         Marland Kitchen spironolactone (ALDACTONE) 25 MG tablet   Oral   Take 25 mg by mouth daily.          BP 157/88  Pulse 75  Temp(Src) 98.2 F (36.8 C) (Oral)  Resp 16  Ht 5\' 11"  (1.803 m)  Wt 295 lb (133.811 kg)  BMI 41.16 kg/m2  SpO2 99% Physical Exam  Nursing note and vitals reviewed. Constitutional: He is oriented to person, place, and time. He appears well-developed and well-nourished.  Morbidly obese male lying on his left side, NAD.  HENT:  Head: Normocephalic and atraumatic.  Eyes: Conjunctivae are normal. No scleral icterus.  Neck: Normal range of motion.  Cardiovascular: Normal rate, regular rhythm and normal heart sounds.   Pulmonary/Chest: Effort normal and breath sounds normal. No respiratory distress. He has no wheezes. He has no rales. He exhibits no tenderness.  Abdominal: Soft. Bowel sounds are normal. He exhibits no distension and no mass. There is no tenderness. There is no rebound and no guarding.  Musculoskeletal: Normal range of motion. He exhibits tenderness ( right lumbar musculature, right buttock and right lateral thigh). He exhibits no edema.       Back:       Legs: Neurological: He is alert and oriented to person, place, and time.  Pt able to ambulate w/o difficulty  Skin: Skin is warm and dry.    ED Course  Procedures (including critical care time) Labs Review Labs Reviewed - No data to display Imaging Review No results found.  EKG Interpretation   None       MDM   1. Sciatica neuralgia, right    Pt presenting with signs and symptoms consistent with sciatic.  No red flag symptoms. Records from [facility].  I agree with pt's initial dx of sciatic.  Pt is non-tender along spine.  Able to ambulate without difficulty.  Do not believe further imaging or workup is needed at this time. Advised pt it may take  another week or two for pain to resolve. Advised  to continue taking pain medication prescribed to him on Saturday and to f/u with his PCP for continued pain.  Return precautions provided. Pt verbalized understanding and agreement with tx plan.  Provided pt education plan on sciatica that includes stretching rehab.    Junius Finner, PA-C 05/06/13 0053  Junius Finner, PA-C 05/06/13 [phone]

## 2013-05-06 NOTE — ED Provider Notes (Signed)
Medical screening examination/treatment/procedure(s) were performed by non-physician practitioner and as supervising physician I was immediately available for consultation/collaboration.  EKG Interpretation   None         Audree Camel, MD 05/06/13 [phone]

## 2016-05-01 DIAGNOSIS — I429 Cardiomyopathy, unspecified: Secondary | ICD-10-CM

## 2016-05-01 DIAGNOSIS — Z95810 Presence of automatic (implantable) cardiac defibrillator: Secondary | ICD-10-CM | POA: Insufficient documentation

## 2016-05-01 HISTORY — DX: Presence of automatic (implantable) cardiac defibrillator: Z95.810

## 2016-05-01 HISTORY — DX: Cardiomyopathy, unspecified: I42.9

## 2016-12-17 DIAGNOSIS — N179 Acute kidney failure, unspecified: Secondary | ICD-10-CM

## 2016-12-17 DIAGNOSIS — K219 Gastro-esophageal reflux disease without esophagitis: Secondary | ICD-10-CM

## 2016-12-17 DIAGNOSIS — I129 Hypertensive chronic kidney disease with stage 1 through stage 4 chronic kidney disease, or unspecified chronic kidney disease: Secondary | ICD-10-CM

## 2016-12-17 DIAGNOSIS — E1122 Type 2 diabetes mellitus with diabetic chronic kidney disease: Secondary | ICD-10-CM | POA: Insufficient documentation

## 2016-12-17 DIAGNOSIS — I5022 Chronic systolic (congestive) heart failure: Secondary | ICD-10-CM | POA: Insufficient documentation

## 2016-12-17 DIAGNOSIS — N182 Chronic kidney disease, stage 2 (mild): Secondary | ICD-10-CM

## 2016-12-17 HISTORY — DX: Gastro-esophageal reflux disease without esophagitis: K21.9

## 2016-12-17 HISTORY — DX: Acute kidney failure, unspecified: N17.9

## 2016-12-17 HISTORY — DX: Chronic kidney disease, stage 2 (mild): N18.2

## 2016-12-17 HISTORY — DX: Chronic systolic (congestive) heart failure: I50.22

## 2016-12-17 HISTORY — DX: Type 2 diabetes mellitus with diabetic chronic kidney disease: E11.22

## 2016-12-17 HISTORY — DX: Hypertensive chronic kidney disease with stage 1 through stage 4 chronic kidney disease, or unspecified chronic kidney disease: I12.9

## 2017-03-26 DIAGNOSIS — M898X9 Other specified disorders of bone, unspecified site: Secondary | ICD-10-CM

## 2017-03-26 DIAGNOSIS — M9080 Osteopathy in diseases classified elsewhere, unspecified site: Secondary | ICD-10-CM

## 2017-03-26 DIAGNOSIS — E889 Metabolic disorder, unspecified: Secondary | ICD-10-CM | POA: Insufficient documentation

## 2017-03-26 DIAGNOSIS — E559 Vitamin D deficiency, unspecified: Secondary | ICD-10-CM

## 2017-03-26 HISTORY — DX: Other specified disorders of bone, unspecified site: M89.8X9

## 2017-03-26 HISTORY — DX: Metabolic disorder, unspecified: E88.9

## 2017-03-26 HISTORY — DX: Vitamin D deficiency, unspecified: E55.9

## 2018-07-09 DIAGNOSIS — R0600 Dyspnea, unspecified: Secondary | ICD-10-CM

## 2018-07-09 DIAGNOSIS — I129 Hypertensive chronic kidney disease with stage 1 through stage 4 chronic kidney disease, or unspecified chronic kidney disease: Secondary | ICD-10-CM

## 2018-07-09 DIAGNOSIS — I255 Ischemic cardiomyopathy: Secondary | ICD-10-CM

## 2018-07-09 DIAGNOSIS — I509 Heart failure, unspecified: Secondary | ICD-10-CM | POA: Diagnosis not present

## 2018-07-09 DIAGNOSIS — I340 Nonrheumatic mitral (valve) insufficiency: Secondary | ICD-10-CM | POA: Diagnosis not present

## 2018-07-09 DIAGNOSIS — E1122 Type 2 diabetes mellitus with diabetic chronic kidney disease: Secondary | ICD-10-CM

## 2018-07-09 DIAGNOSIS — N182 Chronic kidney disease, stage 2 (mild): Secondary | ICD-10-CM

## 2018-07-23 ENCOUNTER — Ambulatory Visit (INDEPENDENT_AMBULATORY_CARE_PROVIDER_SITE_OTHER): Payer: Medicare Other | Admitting: Cardiology

## 2018-07-23 ENCOUNTER — Encounter: Payer: Self-pay | Admitting: Cardiology

## 2018-07-23 VITALS — BP 135/72 | HR 91 | Ht 71.0 in | Wt 273.0 lb

## 2018-07-23 DIAGNOSIS — Z95810 Presence of automatic (implantable) cardiac defibrillator: Secondary | ICD-10-CM

## 2018-07-23 DIAGNOSIS — J45909 Unspecified asthma, uncomplicated: Secondary | ICD-10-CM | POA: Insufficient documentation

## 2018-07-23 DIAGNOSIS — I429 Cardiomyopathy, unspecified: Secondary | ICD-10-CM

## 2018-07-23 DIAGNOSIS — N182 Chronic kidney disease, stage 2 (mild): Secondary | ICD-10-CM

## 2018-07-23 DIAGNOSIS — I2510 Atherosclerotic heart disease of native coronary artery without angina pectoris: Secondary | ICD-10-CM

## 2018-07-23 DIAGNOSIS — I129 Hypertensive chronic kidney disease with stage 1 through stage 4 chronic kidney disease, or unspecified chronic kidney disease: Secondary | ICD-10-CM | POA: Diagnosis not present

## 2018-07-23 DIAGNOSIS — I5022 Chronic systolic (congestive) heart failure: Secondary | ICD-10-CM

## 2018-07-23 DIAGNOSIS — R0602 Shortness of breath: Secondary | ICD-10-CM | POA: Insufficient documentation

## 2018-07-23 DIAGNOSIS — M109 Gout, unspecified: Secondary | ICD-10-CM | POA: Insufficient documentation

## 2018-07-23 DIAGNOSIS — E0822 Diabetes mellitus due to underlying condition with diabetic chronic kidney disease: Secondary | ICD-10-CM

## 2018-07-23 HISTORY — DX: Unspecified asthma, uncomplicated: J45.909

## 2018-07-23 HISTORY — DX: Atherosclerotic heart disease of native coronary artery without angina pectoris: I25.10

## 2018-07-23 MED ORDER — METOPROLOL SUCCINATE ER 25 MG PO TB24: 25.0000 mg | ORAL_TABLET | Freq: Every day | ORAL | 6 refills | Status: DC

## 2018-07-23 NOTE — Patient Instructions (Addendum)
Medication Instructions:  Your physician has recommended you make the following change in your medication:   STOP Metoprolol Tartrate 25 mg.  START Metoprolol Succinate 25 mg daily.   If you need a refill on your cardiac medications before your next appointment, please call your pharmacy.   Lab work: Today  If you have labs (blood work) drawn today and your tests are completely normal, you will receive your results only by: Marland Kitchen MyChart Message (if you have MyChart) OR . A paper copy in the mail If you have any lab test that is abnormal or we need to change your treatment, we will call you to review the results.  Testing/Procedures: We have requested you see our Electrophysiology Consult to follow your ICD.  Follow-Up: At [facility], you and your health needs are our priority.  As part of our continuing mission to provide you with exceptional heart care, we have created designated Provider Care Teams.  These Care Teams include your primary Cardiologist (physician) and Advanced Practice Providers (APPs -  Physician Assistants and Nurse Practitioners) who all work together to provide you with the care you need, when you need it. You will need a follow up appointment in 3 months.  Please call our office 2 months in advance to schedule this appointment.  You may see Dr. Tomie China or another member of our [employer] Provider Team in [place]: Gypsy Balsam, MD . Norman Herrlich, MD

## 2018-07-23 NOTE — Progress Notes (Signed)
Cardiology Office Note:    Date:  07/23/2018   ID:  CACHE JACOBY, DOB 1960/06/19, MRN [id number]  PCP:  System, Pcp Not In  Cardiologist:  Garwin Brothers, MD   Referring MD: No ref. provider found    ASSESSMENT:    1. Cardiomyopathy, unspecified type (HCC)   2. Benign hypertension with chronic kidney disease, stage II   3. Chronic systolic congestive heart failure (HCC)   4. ICD (implantable cardioverter-defibrillator), single, in situ   5. Diabetes mellitus due to underlying condition, controlled, with stage 2 chronic kidney disease, without long-term current use of insulin (HCC)    PLAN:    In order of problems listed above:  1. Primary prevention stressed with the patient.  Importance of compliance with diet and medication stressed and he vocalized understanding.  His blood pressure is stable. 2. Diet was discussed for dyslipidemia and obesity.  Congestive heart failure education was given as well as diet and checking her weight on a regular basis.  He has advanced cardiomyopathy.  Echocardiogram report from [facility] reveals significantly and severely depressed systolic function. 3. He will have a Chem-7 at her office today.  We will set him up with our electrophysiology colleagues for his EP needs and management 4. Patient will be seen in follow-up appointment in 3 months or earlier if the patient has any concerns    Medication Adjustments/Labs and Tests Ordered: Current medicines are reviewed at length with the patient today.  Concerns regarding medicines are outlined above.  No orders of the defined types were placed in this encounter.  No orders of the defined types were placed in this encounter.    No chief complaint on file.    History of Present Illness:    Austin [name] is a 58 y.o. male.  This patient has been under my care in my previous practice.  He is here now to transfer his care and be established with my current practice.  Patient has been  treated for congestive heart failure recently.  He has not been very compliant with his diet and medications.  Now he tells me he is trying to do better.  He has a defibrillator.  He was followed in the other cardiology practice in town.  Post hospital discharge she denies any problems no chest pain orthopnea or PND.  At the time of my evaluation, the patient is alert awake oriented and in no distress.  Past Medical History:  Diagnosis Date  . Automatic implantable cardioverter-defibrillator in situ   . Chronic bronchitis (HCC)    Take advair inhaler  . Diabetes mellitus without complication (HCC)    takes insulin  . Gout   . Hyperlipemia   . Hypertension   . Myocardial infarction (HCC)   . Pneumonia 2013  . Shortness of breath    with exertion    Past Surgical History:  Procedure Laterality Date  . CARDIAC CATHETERIZATION     stent x 2  . MULTIPLE EXTRACTIONS WITH ALVEOLOPLASTY N/A 03/16/2013   Procedure: MULTIPLE EXTRACTION (#9, 10, 11, 12, 13, 14, 15, 18, 20, 21, 22, 23, 24, 25, 26, 27) WITH ALVEOLOPLASTY;  Surgeon: Georgia Lopes, DDS;  Location: [facility] OR;  Service: Oral Surgery;  Laterality: N/A;  . TOE SURGERY Right    pinky toe    Current Medications: Current Meds  Medication Sig  . allopurinol (ZYLOPRIM) 300 MG tablet Take 300 mg by mouth daily.  Marland Kitchen aspirin 325 MG tablet Take 325  mg by mouth daily.  . Cholecalciferol (VITAMIN D-1000 MAX ST) 25 MCG (1000 UT) tablet Take 1 tablet by mouth daily.  . Fluticasone-Salmeterol (ADVAIR) 100-50 MCG/DOSE AEPB Inhale 1 puff into the lungs every 12 (twelve) hours.  . furosemide (LASIX) 40 MG tablet Take 40 mg by mouth daily.   . hydrALAZINE (APRESOLINE) 25 MG tablet Take 37.5 mg by mouth 3 (three) times daily.  . insulin detemir (LEVEMIR) 100 UNIT/ML injection Inject 10 Units into the skin daily.  Marland Kitchen lisinopril (PRINIVIL,ZESTRIL) 5 MG tablet Take 5 mg by mouth daily.   . metoprolol tartrate (LOPRESSOR) 25 MG tablet Take 25 mg by mouth  daily.   . montelukast (SINGULAIR) 10 MG tablet Take 10 mg by mouth daily.  Marland Kitchen oxyCODONE-acetaminophen (PERCOCET) 10-325 MG per tablet Take 1-2 tablets by mouth every 4 (four) hours as needed for pain.  . simvastatin (ZOCOR) 40 MG tablet Take 40 mg by mouth daily.     Allergies:   Patient has no known allergies.   Social History   Socioeconomic History  . Marital status: Single    Spouse name: Not on file  . Number of children: Not on file  . Years of education: Not on file  . Highest education level: Not on file  Occupational History  . Not on file  Social Needs  . Financial resource strain: Not on file  . Food insecurity:    Worry: Not on file    Inability: Not on file  . Transportation needs:    Medical: Not on file    Non-medical: Not on file  Tobacco Use  . Smoking status: Former Games developer  . Smokeless tobacco: Never Used  Substance and Sexual Activity  . Alcohol use: Yes    Alcohol/week: 2.0 standard drinks    Types: 2 Cans of beer per week    Comment: occasional  . Drug use: No  . Sexual activity: Not on file  Lifestyle  . Physical activity:    Days per week: Not on file    Minutes per session: Not on file  . Stress: Not on file  Relationships  . Social connections:    Talks on phone: Not on file    Gets together: Not on file    Attends religious service: Not on file    Active member of club or organization: Not on file    Attends meetings of clubs or organizations: Not on file    Relationship status: Not on file  Other Topics Concern  . Not on file  Social History Narrative  . Not on file     Family History: The patient's family history is not on file.  ROS:   Please see the history of present illness.    All other systems reviewed and are negative.  EKGs/Labs/Other Studies Reviewed:    The following studies were reviewed today: I reviewed [facility] records extensively.   Recent Labs: No results found for requested labs within last 8760  hours.  Recent Lipid Panel No results found for: CHOL, TRIG, HDL, CHOLHDL, VLDL, LDLCALC, LDLDIRECT  Physical Exam:    VS:  BP 135/72 (BP Location: Right Arm, Patient Position: Sitting, Cuff Size: Normal)   Pulse 91   Ht 5\' 11"  (1.803 m)   Wt 273 lb (123.8 kg)   SpO2 98%   BMI 38.08 kg/m     Wt Readings from Last 3 Encounters:  07/23/18 273 lb (123.8 kg)  05/05/13 295 lb (133.8 kg)  03/13/13 Marland Kitchen)  306 lb 3 oz (138.9 kg)     GEN: Patient is in no acute distress HEENT: Normal NECK: No JVD; No carotid bruits LYMPHATICS: No lymphadenopathy CARDIAC: Hear sounds regular, 2/6 systolic murmur at the apex. RESPIRATORY:  Clear to auscultation without rales, wheezing or rhonchi  ABDOMEN: Soft, non-tender, non-distended MUSCULOSKELETAL:  No edema; No deformity  SKIN: Warm and dry NEUROLOGIC:  Alert and oriented x 3 PSYCHIATRIC:  Normal affect   Signed, Garwin Brothers, MD  07/23/2018 11:32 AM    [facility]

## 2018-07-24 ENCOUNTER — Telehealth: Payer: Self-pay | Admitting: *Deleted

## 2018-07-24 DIAGNOSIS — E875 Hyperkalemia: Secondary | ICD-10-CM

## 2018-07-24 LAB — BASIC METABOLIC PANEL
BUN / CREAT RATIO: 22 — AB (ref 9–20)
BUN: 38 mg/dL — ABNORMAL HIGH (ref 6–24)
CHLORIDE: 104 mmol/L (ref 96–106)
CO2: 22 mmol/L (ref 20–29)
Calcium: 10.3 mg/dL — ABNORMAL HIGH (ref 8.7–10.2)
Creatinine, Ser: 1.74 mg/dL — ABNORMAL HIGH (ref 0.76–1.27)
GFR calc Af Amer: 49 mL/min/{1.73_m2} — ABNORMAL LOW (ref >59)
GFR, EST NON AFRICAN AMERICAN: 42 mL/min/{1.73_m2} — AB (ref >59)
Glucose: 114 mg/dL — ABNORMAL HIGH (ref 65–99)
POTASSIUM: 5.4 mmol/L — AB (ref 3.5–5.2)
Sodium: 141 mmol/L (ref 134–144)

## 2018-07-24 MED ORDER — SODIUM POLYSTYRENE SULFONATE 15 GM/60ML PO SUSP
15.0000 g | Freq: Once | ORAL | 0 refills | Status: AC
Start: 2018-07-24 — End: 2018-07-24

## 2018-07-24 NOTE — Telephone Encounter (Signed)
Patient informed of lab results and advised to take a one time dose of kayexalate 15 grams. Patient is agreeable and prescription has been sent to [facility] in [place] as requested. Patient will limited his intake of potassium rich foods and return to our office in 1 week for repeat lab work, no appointment needed. . No further questions.

## 2018-07-24 NOTE — Telephone Encounter (Signed)
-----   Message from Garwin Brothers, MD sent at 07/24/2018  8:49 AM EST ----- Kayexalate 15 g one-time only.  Keep potassium low in diet intake.  Recheck Chem-7 in 1 week. Garwin Brothers, MD 07/24/2018 8:48 AM

## 2018-07-25 ENCOUNTER — Other Ambulatory Visit: Payer: Self-pay

## 2018-07-25 MED ORDER — SODIUM POLYSTYRENE SULFONATE PO POWD: Freq: Once | ORAL | 0 refills | Status: AC

## 2018-08-02 LAB — BASIC METABOLIC PANEL
BUN/Creatinine Ratio: 17 (ref 9–20)
BUN: 26 mg/dL — ABNORMAL HIGH (ref 6–24)
CO2: 26 mmol/L (ref 20–29)
Calcium: 9.9 mg/dL (ref 8.7–10.2)
Chloride: 100 mmol/L (ref 96–106)
Creatinine, Ser: 1.54 mg/dL — ABNORMAL HIGH (ref 0.76–1.27)
GFR calc Af Amer: 57 mL/min/{1.73_m2} — ABNORMAL LOW (ref >59)
GFR calc non Af Amer: 49 mL/min/{1.73_m2} — ABNORMAL LOW (ref >59)
GLUCOSE: 183 mg/dL — AB (ref 65–99)
Potassium: 4.1 mmol/L (ref 3.5–5.2)
Sodium: 142 mmol/L (ref 134–144)

## 2018-08-04 ENCOUNTER — Telehealth: Payer: Self-pay

## 2018-08-04 NOTE — Telephone Encounter (Signed)
The patient has been notified of the result and verbalized understanding. No further questions at this time. Patient states his primary care is [facility] copy of labs sent to Dr. Ardelle Park per Dr.RRR request.

## 2018-09-05 ENCOUNTER — Telehealth: Payer: Self-pay | Admitting: *Deleted

## 2018-09-05 NOTE — Telephone Encounter (Signed)
DEVICE INFORMATION [employer]  Lumax 740-VRT   S#     [phone]

## 2018-09-08 NOTE — Telephone Encounter (Signed)
Patient states that he doesn't have a home monitor. I will have him a new one ordered. E mail sent to [employer] Rep to order pt a new monitor.

## 2018-09-08 NOTE — Telephone Encounter (Signed)
E Mail sent to [employer] rep to get PID number to transfer patient into our clinic.

## 2018-09-08 NOTE — Telephone Encounter (Signed)
LMOVM for pt to return call.  Is patient home monitor updating? Is patient home monitor plugged up? Is patient sleeping w/in 4-6 ft of monitor? Is green ok light on?

## 2018-09-15 NOTE — Telephone Encounter (Signed)
Spoke w/ [employer] rep. He informed me that pt has been ordered a new monitor.

## 2018-09-16 NOTE — Telephone Encounter (Signed)
Spoke w/ pt and informed him that he should have the new monitor at the beginning of the next week. Informed him that once he receives the monitor to plug it up within 4-6 ft of where he sleeps. I sent a staff message to myself to follow up w/ this next week.

## 2018-09-17 ENCOUNTER — Encounter: Payer: Self-pay | Admitting: *Deleted

## 2018-09-22 ENCOUNTER — Institutional Professional Consult (permissible substitution): Payer: Medicare Other | Admitting: Cardiology

## 2018-10-06 ENCOUNTER — Ambulatory Visit (INDEPENDENT_AMBULATORY_CARE_PROVIDER_SITE_OTHER): Payer: Medicare Other | Admitting: *Deleted

## 2018-10-06 DIAGNOSIS — I429 Cardiomyopathy, unspecified: Secondary | ICD-10-CM

## 2018-10-28 ENCOUNTER — Telehealth: Payer: Self-pay | Admitting: Cardiology

## 2018-10-28 NOTE — Telephone Encounter (Signed)
Virtual Visit Pre-Appointment Phone Call  "(Name), I am calling you today to discuss your upcoming appointment. We are currently trying to limit exposure to the virus that causes COVID-19 by seeing patients at home rather than in the office."  1. "What is the BEST phone number to call the day of the visit?" - include this in appointment notes  2. Do you have or have access to (through a family member/friend) a smartphone with video capability that we can use for your visit?" a. If yes - list this number in appt notes as cell (if different from BEST phone #) and list the appointment type as a VIDEO visit in appointment notes b. If no - list the appointment type as a PHONE visit in appointment notes  3. Confirm consent - "In the setting of the current Covid19 crisis, you are scheduled for a (phone or video) visit with your provider on (date) at (time).  Just as we do with many in-office visits, in order for you to participate in this visit, we must obtain consent.  If you'd like, I can send this to your mychart (if signed up) or email for you to review.  Otherwise, I can obtain your verbal consent now.  All virtual visits are billed to your insurance company just like a normal visit would be.  By agreeing to a virtual visit, we'd like you to understand that the technology does not allow for your provider to perform an examination, and thus may limit your provider's ability to fully assess your condition. If your provider identifies any concerns that need to be evaluated in person, we will make arrangements to do so.  Finally, though the technology is pretty good, we cannot assure that it will always work on either your or our end, and in the setting of a video visit, we may have to convert it to a phone-only visit.  In either situation, we cannot ensure that we have a secure connection.  Are you willing to proceed?" STAFF: Did the patient verbally acknowledge consent to telehealth visit? Document  YES/NO here: YES  4. Advise patient to be prepared - "Two hours prior to your appointment, go ahead and check your blood pressure, pulse, oxygen saturation, and your weight (if you have the equipment to check those) and write them all down. When your visit starts, your provider will ask you for this information. If you have an Apple Watch or Kardia device, please plan to have heart rate information ready on the day of your appointment. Please have a pen and paper handy nearby the day of the visit as well."  5. Give patient instructions for MyChart download to smartphone OR Doximity/Doxy.me as below if video visit (depending on what platform provider is using)  6. Inform patient they will receive a phone call 15 minutes prior to their appointment time (may be from unknown caller ID) so they should be prepared to answer    TELEPHONE CALL NOTE  Austin [name] has been deemed a candidate for a follow-up tele-health visit to limit community exposure during the Covid-19 pandemic. I spoke with the patient via phone to ensure availability of phone/video source, confirm preferred email & phone number, and discuss instructions and expectations.  I reminded Austin [name] to be prepared with any vital sign and/or heart rhythm information that could potentially be obtained via home monitoring, at the time of his visit. I reminded Austin [name] to expect a phone call prior to  his visit.  Minette Brine 10/28/2018 9:28 AM   INSTRUCTIONS FOR DOWNLOADING THE MYCHART APP TO SMARTPHONE  - The patient must first make sure to have activated MyChart and know their login information - If Apple, go to [employer] and type in MyChart in the search bar and download the app. If Android, ask patient to go to [employer] and type in [place] in the search bar and download the app. The app is free but as with any other app downloads, their phone may require them to verify saved payment information or Apple/Android  password.  - The patient will need to then log into the app with their MyChart username and password, and select  as their healthcare provider to link the account. When it is time for your visit, go to the MyChart app, find appointments, and click Begin Video Visit. Be sure to Select Allow for your device to access the Microphone and Camera for your visit. You will then be connected, and your provider will be with you shortly.  **If they have any issues connecting, or need assistance please contact MyChart service desk [phone])**  **If using a computer, in order to ensure the best quality for their visit they will need to use either of the following Internet Browsers: [employer] or Google Chrome**  IF USING DOXIMITY or DOXY.ME - The patient will receive a link just prior to their visit by text.     FULL LENGTH CONSENT FOR TELE-HEALTH VISIT   I hereby voluntarily request, consent and authorize [facility] and its employed or contracted physicians, physician assistants, nurse practitioners or other licensed health care professionals (the Practitioner), to provide me with telemedicine health care services (the Services") as deemed necessary by the treating Practitioner. I acknowledge and consent to receive the Services by the Practitioner via telemedicine. I understand that the telemedicine visit will involve communicating with the Practitioner through live audiovisual communication technology and the disclosure of certain medical information by electronic transmission. I acknowledge that I have been given the opportunity to request an in-person assessment or other available alternative prior to the telemedicine visit and am voluntarily participating in the telemedicine visit.  I understand that I have the right to withhold or withdraw my consent to the use of telemedicine in the course of my care at any time, without affecting my right to future care or treatment,  and that the Practitioner or I may terminate the telemedicine visit at any time. I understand that I have the right to inspect all information obtained and/or recorded in the course of the telemedicine visit and may receive copies of available information for a reasonable fee.  I understand that some of the potential risks of receiving the Services via telemedicine include:   Delay or interruption in medical evaluation due to technological equipment failure or disruption;  Information transmitted may not be sufficient (e.g. poor resolution of images) to allow for appropriate medical decision making by the Practitioner; and/or   In rare instances, security protocols could fail, causing a breach of personal health information.  Furthermore, I acknowledge that it is my responsibility to provide information about my medical history, conditions and care that is complete and accurate to the best of my ability. I acknowledge that Practitioner's advice, recommendations, and/or decision may be based on factors not within their control, such as incomplete or inaccurate data provided by me or distortions of diagnostic images or specimens that may result from electronic transmissions. I  understand that the practice of medicine is not an exact science and that Practitioner makes no warranties or guarantees regarding treatment outcomes. I acknowledge that I will receive a copy of this consent concurrently upon execution via email to the email address I last provided but may also request a printed copy by calling the office of [place].    I understand that my insurance will be billed for this visit.   I have read or had this consent read to me.  I understand the contents of this consent, which adequately explains the benefits and risks of the Services being provided via telemedicine.   I have been provided ample opportunity to ask questions regarding this consent and the Services and have had my questions  answered to my satisfaction.  I give my informed consent for the services to be provided through the use of telemedicine in my medical care  By participating in this telemedicine visit I agree to the above.

## 2018-10-29 ENCOUNTER — Encounter: Payer: Self-pay | Admitting: Cardiology

## 2018-10-29 ENCOUNTER — Other Ambulatory Visit: Payer: Self-pay

## 2018-10-29 ENCOUNTER — Telehealth (INDEPENDENT_AMBULATORY_CARE_PROVIDER_SITE_OTHER): Payer: Medicare Other | Admitting: Cardiology

## 2018-10-29 VITALS — Ht 71.0 in | Wt 280.0 lb

## 2018-10-29 DIAGNOSIS — I5022 Chronic systolic (congestive) heart failure: Secondary | ICD-10-CM

## 2018-10-29 DIAGNOSIS — Z95810 Presence of automatic (implantable) cardiac defibrillator: Secondary | ICD-10-CM

## 2018-10-29 DIAGNOSIS — I429 Cardiomyopathy, unspecified: Secondary | ICD-10-CM

## 2018-10-29 DIAGNOSIS — I129 Hypertensive chronic kidney disease with stage 1 through stage 4 chronic kidney disease, or unspecified chronic kidney disease: Secondary | ICD-10-CM

## 2018-10-29 DIAGNOSIS — N182 Chronic kidney disease, stage 2 (mild): Secondary | ICD-10-CM

## 2018-10-29 DIAGNOSIS — Z13220 Encounter for screening for lipoid disorders: Secondary | ICD-10-CM

## 2018-10-29 DIAGNOSIS — E0822 Diabetes mellitus due to underlying condition with diabetic chronic kidney disease: Secondary | ICD-10-CM

## 2018-10-29 DIAGNOSIS — Z1329 Encounter for screening for other suspected endocrine disorder: Secondary | ICD-10-CM

## 2018-10-29 NOTE — Progress Notes (Signed)
Virtual Visit via Telephone Note   This visit type was conducted due to national recommendations for restrictions regarding the COVID-19 Pandemic (e.g. social distancing) in an effort to limit this patient's exposure and mitigate transmission in our community.  Due to his co-morbid illnesses, this patient is at least at moderate risk for complications without adequate follow up.  This format is felt to be most appropriate for this patient at this time.  The patient did not have access to video technology/had technical difficulties with video requiring transitioning to audio format only (telephone).  All issues noted in this document were discussed and addressed.  No physical exam could be performed with this format.  Please refer to the patient's chart for his  consent to telehealth for [facility].   Date:  10/29/2018   ID:  [name], DOB [birth year], MRN [id number]  Patient Location: Home Provider Location: Home  PCP:  Paulina Fusi, MD  Cardiologist:  No primary care provider on file.  Electrophysiologist:  None   Evaluation Performed:  Follow-Up Visit  Chief Complaint: Cardiomyopathy  History of Present Illness:    MURRY CHRISTMAN is a 58 y.o. male with history of essential hypertension, lipidemia, diabetes mellitus and advanced cardiomyopathy and the patient has a defibrillator.  He denies any problems at this time and takes care of activities of daily living.  No chest pain orthopnea or PND.  He is walking about half a mile without any symptoms.  He denies dyspnea more than his baseline.  At the time of my evaluation, the patient is alert awake oriented and in no distress.  The patient does not have symptoms concerning for COVID-19 infection (fever, chills, cough, or new shortness of breath).    Past Medical History:  Diagnosis Date  . Automatic implantable cardioverter-defibrillator in situ   . Chronic bronchitis (HCC)    Take advair inhaler  . Diabetes mellitus  without complication (HCC)    takes insulin  . Gout   . Hyperlipemia   . Hypertension   . Myocardial infarction (HCC)   . Pneumonia 2013  . Shortness of breath    with exertion   Past Surgical History:  Procedure Laterality Date  . CARDIAC CATHETERIZATION     stent x 2  . MULTIPLE EXTRACTIONS WITH ALVEOLOPLASTY N/A 03/16/2013   Procedure: MULTIPLE EXTRACTION (#9, 10, 11, 12, 13, 14, 15, 18, 20, 21, 22, 23, 24, 25, 26, 27) WITH ALVEOLOPLASTY;  Surgeon: Georgia Lopes, DDS;  Location: [facility] OR;  Service: Oral Surgery;  Laterality: N/A;  . TOE SURGERY Right    pinky toe     Current Meds  Medication Sig  . allopurinol (ZYLOPRIM) 300 MG tablet Take 300 mg by mouth daily.  Marland Kitchen aspirin 325 MG tablet Take 325 mg by mouth daily.  . Cholecalciferol (VITAMIN D-1000 MAX ST) 25 MCG (1000 UT) tablet Take 1 tablet by mouth daily.  . Fluticasone-Salmeterol (ADVAIR) 100-50 MCG/DOSE AEPB Inhale 1 puff into the lungs every 12 (twelve) hours.  . furosemide (LASIX) 40 MG tablet Take 40 mg by mouth daily.   . hydrALAZINE (APRESOLINE) 25 MG tablet Take 37.5 mg by mouth 3 (three) times daily.  . insulin detemir (LEVEMIR) 100 UNIT/ML injection Inject 10 Units into the skin daily.  Marland Kitchen lisinopril (PRINIVIL,ZESTRIL) 5 MG tablet Take 5 mg by mouth daily.   . metoprolol succinate (TOPROL XL) 25 MG 24 hr tablet Take 1 tablet (25 mg total) by mouth daily.  . montelukast (  SINGULAIR) 10 MG tablet Take 10 mg by mouth daily.  Marland Kitchen oxyCODONE-acetaminophen (PERCOCET) 10-325 MG per tablet Take 1-2 tablets by mouth every 4 (four) hours as needed for pain.  . simvastatin (ZOCOR) 40 MG tablet Take 40 mg by mouth daily.     Allergies:   Patient has no known allergies.   Social History   Tobacco Use  . Smoking status: Former Games developer  . Smokeless tobacco: Never Used  Substance Use Topics  . Alcohol use: Yes    Alcohol/week: 2.0 standard drinks    Types: 2 Cans of beer per week    Comment: occasional  . Drug use: No      Family Hx: The patient's family history is not on file.  ROS:   Please see the history of present illness.    As mentioned above All other systems reviewed and are negative.   Prior CV studies:   The following studies were reviewed today:  I reviewed echocardiogram report and discussed with the patient  Labs/Other Tests and Data Reviewed:    EKG:  No ECG reviewed.  Recent Labs: 08/01/2018: BUN 26; Creatinine, Ser 1.54; Potassium 4.1; Sodium 142   Recent Lipid Panel No results found for: CHOL, TRIG, HDL, CHOLHDL, LDLCALC, LDLDIRECT  Wt Readings from Last 3 Encounters:  10/29/18 280 lb (127 kg)  07/23/18 273 lb (123.8 kg)  05/05/13 295 lb (133.8 kg)     Objective:    Vital Signs:  Ht 5\' 11"  (1.803 m)   Wt 280 lb (127 kg)   BMI 39.05 kg/m    VITAL SIGNS:  reviewed  ASSESSMENT & PLAN:    1. Cardiomyopathy advanced and congestive heart failure: I discussed my findings with the patient at extensive length.  Diet and the importance of checking weight on a regular basis was stressed he is doing better on a regular basis.  He tells me that he is also aggressively trying to lose weight.  He is morbidly obese.  Risks of obesity explained. 2. Essential hypertension: Blood pressure is stable according to the patient.  He does not check it on a regular basis and I encouraged him to buy a machine to keep a track of his blood pressures 3. Mixed dyslipidemia I will get him for blood work in the next few days he will have a Chem-7 CBC TSH and liver lipid check. 4. History of cardiomyopathy with intracardiac defibrillator: We will get an appointment for him with our electrophysiology colleagues to be established and to keep a track of his defibrillator functioning and optimization. 5. Patient will be seen in follow-up appointment in 4 months or earlier if the patient has any concerns   COVID-19 Education: The signs and symptoms of COVID-19 were discussed with the patient and how to seek  care for testing (follow up with PCP or arrange E-visit).  The importance of social distancing was discussed today.  Time:   Today, I have spent 15 minutes with the patient with telehealth technology discussing the above problems.     Medication Adjustments/Labs and Tests Ordered: Current medicines are reviewed at length with the patient today.  Concerns regarding medicines are outlined above.   Tests Ordered: No orders of the defined types were placed in this encounter.   Medication Changes: No orders of the defined types were placed in this encounter.   Disposition:  Follow up in 3 month(s)  Signed, Garwin Brothers, MD  10/29/2018 10:47 AM    [facility]  

## 2018-10-29 NOTE — Addendum Note (Signed)
Addended by: Pamala Hurry on: 10/29/2018 11:49 AM   Modules accepted: Orders

## 2018-10-29 NOTE — Patient Instructions (Signed)
Medication Instructions:  Your physician recommends that you continue on your current medications as directed. Please refer to the Current Medication list given to you today.  If you need a refill on your cardiac medications before your next appointment, please call your pharmacy.   Lab work: Your physician recommends that you return for FASTING lab work and have a CBC,BMP,lipid,liver and TSH drawn.   If you have labs (blood work) drawn today and your tests are completely normal, you will receive your results only by: Marland Kitchen MyChart Message (if you have MyChart) OR . A paper copy in the mail If you have any lab test that is abnormal or we need to change your treatment, we will call you to review the results.  Testing/Procedures: Your physician has recommended that you have an EP Study. This test is used to assess serious arrhythmias (irregular heartbeats). During an Electro-physiology Study (EPS), a thin, flexible wire is passed through a vein in your groin (upper thigh) or neck up to the heart. The wire records the heart's electrical signals. Your doctor uses the wire to electrically stimulate your heart and trigger an arrhythmic. This allows the doctor to see whether an antiarrhythmia medicine can help manage the problem or if further procedures are necessary (i.e., ablation/ICD). Radiofrequency ablation, a procedure used to fix some types of arrthythmia, may be done during an EPS. This is done in the hospital and often requires an overnight stay. Please see the instruction sheet given to your today for more information.   Follow-Up: At [facility], you and your health needs are our priority.  As part of our continuing mission to provide you with exceptional heart care, we have created designated Provider Care Teams.  These Care Teams include your primary Cardiologist (physician) and Advanced Practice Providers (APPs -  Physician Assistants and Nurse Practitioners) who all work together to provide  you with the care you need, when you need it. You will need a follow up appointment in 4 months.   Any Other Special Instructions Will Be Listed Below   Electrophysiology Study An electrophysiology (EP) study is a heart test in which thin, flexible tubes (catheters) are placed in a large vein in your groin, arm, neck, or chest. This test is done to evaluate the electrical conduction system of your heart. You may need this test if you have:  Dizziness or fainting.  A fast heartbeat (tachycardia).  A slow heartbeat (bradycardia).  An irregular heartbeat (arrhythmia), such as atrial fibrillation. Tell a health care provider about:  Any allergies you have.  All medicines you are taking, including vitamins, herbs, eye drops, creams, and over-the-counter medicines.  Any problems you or family members have had with anesthetic medicines.  Any blood disorders you have.  Any surgeries you have had.  Any medical conditions you have.  Whether you are pregnant or may be pregnant. What are the risks? Generally, this is a safe procedure. However, problems may occur, including:  Tachycardia that does not go away.  Bleeding or bruising around the insertion sites.  Infection.  Temporary or permanent heart rhythm abnormalities.  Temporary changes in blood pressure.  Puncture (perforation) of the heart wall or a blood vessel. This can cause bleeding between the heart and the sac that surrounds it (cardiac tamponade).  Possible cardiac arrest or fatal heart arrhythmia.  Allergic reactions to medicines or dyes.  Damage to other structures or organs. What happens before the procedure? Staying hydrated Follow instructions from your health care provider about  hydration, which may include:  Up to 2 hours before the procedure - you may continue to drink clear liquids, such as water, clear fruit juice, black coffee, and plain tea. Eating and drinking restrictions Follow instructions from  your health care provider about eating and drinking, which may include:  8 hours before the procedure - stop eating heavy meals or foods such as meat, fried foods, or fatty foods.  6 hours before the procedure - stop eating light meals or foods, such as toast or cereal.  6 hours before the procedure - stop drinking milk or drinks that contain milk.  2 hours before the procedure - stop drinking clear liquids. Medicines  Ask your health care provider about: ? Changing or stopping your regular medicines. This is especially important if you are taking diabetes medicines or blood thinners. ? Taking medicines such as aspirin and ibuprofen. These medicines can thin your blood. Do not take these medicines before your procedure if your health care provider instructs you not to.  You may be given antibiotic medicine to help prevent infection. General instructions  Plan to have someone take you home from the hospital or clinic.  If you will be going home right after the procedure, plan to have someone with you for 24 hours.  Ask your health care provider how your surgical site will be marked or identified. What happens during the procedure?  To lower your risk of infection: ? Your health care team will wash or sanitize their hands. ? Your skin will be washed with soap. ? Hair may be removed from the surgical area.  An IV tube will be inserted into one of your veins.  You will be given one or more of the following: ? A medicine to help you relax (sedative). ? A medicine to numb the area (local anesthetic). ? A medicine to make you fall asleep (general anesthetic).  Catheters with an electrode tip will be inserted into a large vein. These electrode tips can measure the heart's electrical activity. They can also use electrical signals to change the heart rhythm.  The catheters will be guided to the heart using a type of X-ray machine (fluoroscopy). Once the catheters are in the heart, they  will evaluate the electrical activity of your heart.  If you are awake during the EP study, you may feel dizzy or light-headed. Your heart rate may temporarily increase or you may feel your heart beating hard. Tell your health care provider if you experience these things during the EP study: ? You feel dizzy or nauseous. ? You have chest pain or pressure.  The catheters will be removed.  Firm pressure will be applied to the insertion sites to prevent bleeding.  A bandage (dressing) may be applied over the insertion sites. The procedure may vary among health care providers and hospitals. What happens after the procedure?   Do not drive for 24 hours if you were given a sedative.  Your blood pressure, heart rate, breathing rate, and blood oxygen level will be monitored until the medicines you were given have worn off.  You will need to lie flat for a few hours or as told by your health care provider. Keep your legs straight. Do not bend or cross your legs. This information is not intended to replace advice given to you by your health care provider. Make sure you discuss any questions you have with your health care provider. Document Released: 11/01/2009 Document Revised: 12/16/2015 Document Reviewed: 11/21/2015 Elsevier Interactive  Patient Education  2019 [employer].

## 2018-11-03 ENCOUNTER — Ambulatory Visit: Payer: Medicare Other | Admitting: Cardiology

## 2018-11-05 LAB — BASIC METABOLIC PANEL
BUN/Creatinine Ratio: 14 (ref 9–20)
BUN: 20 mg/dL (ref 6–24)
CO2: 23 mmol/L (ref 20–29)
Calcium: 10.2 mg/dL (ref 8.7–10.2)
Chloride: 102 mmol/L (ref 96–106)
Creatinine, Ser: 1.38 mg/dL — ABNORMAL HIGH (ref 0.76–1.27)
GFR calc Af Amer: 65 mL/min/{1.73_m2} (ref >59)
GFR calc non Af Amer: 56 mL/min/{1.73_m2} — ABNORMAL LOW (ref >59)
Glucose: 119 mg/dL — ABNORMAL HIGH (ref 65–99)
Potassium: 4.7 mmol/L (ref 3.5–5.2)
Sodium: 139 mmol/L (ref 134–144)

## 2018-11-05 LAB — LIPID PANEL
Chol/HDL Ratio: 3.6 ratio (ref 0.0–5.0)
Cholesterol, Total: 331 mg/dL — ABNORMAL HIGH (ref 100–199)
HDL: 93 mg/dL (ref >39)
LDL Calculated: 213 mg/dL — ABNORMAL HIGH (ref 0–99)
Triglycerides: 126 mg/dL (ref 0–149)
VLDL Cholesterol Cal: 25 mg/dL (ref 5–40)

## 2018-11-05 LAB — HEPATIC FUNCTION PANEL
ALT: 192 IU/L — ABNORMAL HIGH (ref 0–44)
AST: 132 IU/L — ABNORMAL HIGH (ref 0–40)
Albumin: 4.8 g/dL (ref 3.8–4.9)
Alkaline Phosphatase: 87 IU/L (ref 39–117)
Bilirubin Total: 0.3 mg/dL (ref 0.0–1.2)
Bilirubin, Direct: 0.12 mg/dL (ref 0.00–0.40)
Total Protein: 7.4 g/dL (ref 6.0–8.5)

## 2018-11-05 LAB — CBC
Hematocrit: 39.8 % (ref 37.5–51.0)
Hemoglobin: 13.1 g/dL (ref 13.0–17.7)
MCH: 30.3 pg (ref 26.6–33.0)
MCHC: 32.9 g/dL (ref 31.5–35.7)
MCV: 92 fL (ref 79–97)
Platelets: 219 10*3/uL (ref 150–450)
RBC: 4.32 x10E6/uL (ref 4.14–5.80)
RDW: 14.6 % (ref 11.6–15.4)
WBC: 6.9 10*3/uL (ref 3.4–10.8)

## 2018-11-05 LAB — TSH: TSH: 1.78 u[IU]/mL (ref 0.450–4.500)

## 2018-11-11 ENCOUNTER — Telehealth: Payer: Self-pay

## 2018-11-11 NOTE — Telephone Encounter (Signed)
-----   Message from Jenean Lindau, MD sent at 11/05/2018  8:20 AM EDT ----- Renal function appears to be stable.  Cholesterol is very high he needs to diet right.  Liver tests are markedly elevated and he needs to see his primary care doctor as soon as possible.  Please call the nurse and let them know about it and send them a copy of these labs and please document in detail Jenean Lindau, MD 11/05/2018 8:19 AM

## 2018-11-11 NOTE — Telephone Encounter (Signed)
Austin [name] spoke with Polly ( Dr.Schultz nurse) informed her of abnormal results and notified that a faxed copy of results was sent.   Left message on patients home phone to call back for results.

## 2018-11-19 ENCOUNTER — Other Ambulatory Visit: Payer: Self-pay | Admitting: Cardiology

## 2018-12-08 ENCOUNTER — Other Ambulatory Visit: Payer: Self-pay | Admitting: Cardiology

## 2019-01-02 ENCOUNTER — Ambulatory Visit (INDEPENDENT_AMBULATORY_CARE_PROVIDER_SITE_OTHER): Payer: Medicare Other | Admitting: *Deleted

## 2019-01-02 DIAGNOSIS — I429 Cardiomyopathy, unspecified: Secondary | ICD-10-CM

## 2019-01-10 ENCOUNTER — Other Ambulatory Visit: Payer: Self-pay | Admitting: Cardiology

## 2019-02-04 ENCOUNTER — Telehealth: Payer: Medicare Other | Admitting: Cardiology

## 2019-02-05 ENCOUNTER — Telehealth: Payer: Medicare Other | Admitting: Cardiology

## 2019-02-09 ENCOUNTER — Other Ambulatory Visit: Payer: Self-pay

## 2019-02-09 ENCOUNTER — Ambulatory Visit (INDEPENDENT_AMBULATORY_CARE_PROVIDER_SITE_OTHER): Payer: Medicare Other | Admitting: Cardiology

## 2019-02-09 ENCOUNTER — Encounter: Payer: Self-pay | Admitting: Cardiology

## 2019-02-09 VITALS — BP 160/102 | HR 98 | Ht 71.0 in | Wt 272.0 lb

## 2019-02-09 DIAGNOSIS — N182 Chronic kidney disease, stage 2 (mild): Secondary | ICD-10-CM

## 2019-02-09 DIAGNOSIS — I2510 Atherosclerotic heart disease of native coronary artery without angina pectoris: Secondary | ICD-10-CM

## 2019-02-09 DIAGNOSIS — E0822 Diabetes mellitus due to underlying condition with diabetic chronic kidney disease: Secondary | ICD-10-CM

## 2019-02-09 DIAGNOSIS — I129 Hypertensive chronic kidney disease with stage 1 through stage 4 chronic kidney disease, or unspecified chronic kidney disease: Secondary | ICD-10-CM | POA: Diagnosis not present

## 2019-02-09 DIAGNOSIS — Z95810 Presence of automatic (implantable) cardiac defibrillator: Secondary | ICD-10-CM

## 2019-02-09 NOTE — Patient Instructions (Signed)
Medication Instructions:  Your physician recommends that you continue on your current medications as directed. Please refer to the Current Medication list given to you today.  If you need a refill on your cardiac medications before your next appointment, please call your pharmacy.   Lab work: NONE If you have labs (blood work) drawn today and your tests are completely normal, you will receive your results only by: Marland Kitchen MyChart Message (if you have MyChart) OR . A paper copy in the mail If you have any lab test that is abnormal or we need to change your treatment, we will call you to review the results.  Testing/Procedures: You had an EKG performed today  Your physician has recommended that you have an EP Study. This test is used to assess serious arrhythmias (irregular heartbeats). During an Electro-physiology Study (EPS), a thin, flexible wire is passed through a vein in your groin (upper thigh) or neck up to the heart. The wire records the heart's electrical signals. Your doctor uses the wire to electrically stimulate your heart and trigger an arrhythmic. This allows the doctor to see whether an antiarrhythmia medicine can help manage the problem or if further procedures are necessary (i.e., ablation/ICD). Radiofrequency ablation, a procedure used to fix some types of arrthythmia, may be done during an EPS. This is done in the hospital and often requires an overnight stay. Please see the instruction sheet given to your today for more information.   Follow-Up: At [facility], you and your health needs are our priority.  As part of our continuing mission to provide you with exceptional heart care, we have created designated Provider Care Teams.  These Care Teams include your primary Cardiologist (physician) and Advanced Practice Providers (APPs -  Physician Assistants and Nurse Practitioners) who all work together to provide you with the care you need, when you need it. You will need a follow up  appointment in 6 months.    Any Other Special Instructions Will Be Listed Below   Electrophysiology Study An electrophysiology (EP) study is a heart test in which thin, flexible tubes (catheters) are placed in a large vein in your groin, arm, neck, or chest. This test is done to evaluate the electrical conduction system of your heart. You may need this test if you have:  Dizziness or fainting.  A fast heartbeat (tachycardia).  A slow heartbeat (bradycardia).  An irregular heartbeat (arrhythmia), such as atrial fibrillation. Tell a health care provider about:  Any allergies you have.  All medicines you are taking, including vitamins, herbs, eye drops, creams, and over-the-counter medicines.  Any problems you or family members have had with anesthetic medicines.  Any blood disorders you have.  Any surgeries you have had.  Any medical conditions you have.  Whether you are pregnant or may be pregnant. What are the risks? Generally, this is a safe procedure. However, problems may occur, including:  Tachycardia that does not go away.  Bleeding or bruising around the insertion sites.  Infection.  Temporary or permanent heart rhythm abnormalities.  Temporary changes in blood pressure.  Puncture (perforation) of the heart wall or a blood vessel. This can cause bleeding between the heart and the sac that surrounds it (cardiac tamponade).  Possible cardiac arrest or fatal heart arrhythmia.  Allergic reactions to medicines or dyes.  Damage to other structures or organs. What happens before the procedure? Staying hydrated Follow instructions from your health care provider about hydration, which may include:  Up to 2 hours before  the procedure - you may continue to drink clear liquids, such as water, clear fruit juice, black coffee, and plain tea. Eating and drinking restrictions Follow instructions from your health care provider about eating and drinking, which may include:   8 hours before the procedure - stop eating heavy meals or foods such as meat, fried foods, or fatty foods.  6 hours before the procedure - stop eating light meals or foods, such as toast or cereal.  6 hours before the procedure - stop drinking milk or drinks that contain milk.  2 hours before the procedure - stop drinking clear liquids. Medicines  Ask your health care provider about: ? Changing or stopping your regular medicines. This is especially important if you are taking diabetes medicines or blood thinners. ? Taking medicines such as aspirin and ibuprofen. These medicines can thin your blood. Do not take these medicines before your procedure if your health care provider instructs you not to.  You may be given antibiotic medicine to help prevent infection. General instructions  Plan to have someone take you home from the hospital or clinic.  If you will be going home right after the procedure, plan to have someone with you for 24 hours.  Ask your health care provider how your surgical site will be marked or identified. What happens during the procedure?  To lower your risk of infection: ? Your health care team will wash or sanitize their hands. ? Your skin will be washed with soap. ? Hair may be removed from the surgical area.  An IV tube will be inserted into one of your veins.  You will be given one or more of the following: ? A medicine to help you relax (sedative). ? A medicine to numb the area (local anesthetic). ? A medicine to make you fall asleep (general anesthetic).  Catheters with an electrode tip will be inserted into a large vein. These electrode tips can measure the heart's electrical activity. They can also use electrical signals to change the heart rhythm.  The catheters will be guided to the heart using a type of X-ray machine (fluoroscopy). Once the catheters are in the heart, they will evaluate the electrical activity of your heart.  If you are awake  during the EP study, you may feel dizzy or light-headed. Your heart rate may temporarily increase or you may feel your heart beating hard. Tell your health care provider if you experience these things during the EP study: ? You feel dizzy or nauseous. ? You have chest pain or pressure.  The catheters will be removed.  Firm pressure will be applied to the insertion sites to prevent bleeding.  A bandage (dressing) may be applied over the insertion sites. The procedure may vary among health care providers and hospitals. What happens after the procedure?   Do not drive for 24 hours if you were given a sedative.  Your blood pressure, heart rate, breathing rate, and blood oxygen level will be monitored until the medicines you were given have worn off.  You will need to lie flat for a few hours or as told by your health care provider. Keep your legs straight. Do not bend or cross your legs. This information is not intended to replace advice given to you by your health care provider. Make sure you discuss any questions you have with your health care provider. Document Released: 11/01/2009 Document Revised: 01/05/2016 Document Reviewed: 11/21/2015 Elsevier Patient Education  2020 [employer].

## 2019-02-09 NOTE — Progress Notes (Signed)
Cardiology Office Note:    Date:  02/09/2019   ID:  Austin [name], DOB 03/06/1961, MRN [id number]  PCP:  Paulina FusiSchultz, Douglas E, MD  Cardiologist:  Garwin Brothersajan R , MD   Referring MD: Paulina FusiSchultz, Douglas E, MD    ASSESSMENT:    1. Coronary artery disease involving native coronary artery of native heart without angina pectoris   2. Benign hypertension with chronic kidney disease, stage II   3. Diabetes mellitus due to underlying condition, controlled, with stage 2 chronic kidney disease, without long-term current use of insulin (HCC)   4. ICD (implantable cardioverter-defibrillator), single, in situ    PLAN:    In order of problems listed above:  1. Cardiomyopathy: I discussed my findings with the patient at extensive length including [facility].  Congestive heart failure education was given.  Diet was discussed including low-salt and regular weight checks and he understands and promises to do so. 2. Post defibrillator insertion: He will be established with our electrophysiology colleagues for follow-up for this. 3. Essential hypertension: Blood pressure stable 4. Obesity: Diet was discussed and he promises to comply.  Patient will be seen in follow-up appointment in 6 months or earlier if the patient has any concerns    Medication Adjustments/Labs and Tests Ordered: Current medicines are reviewed at length with the patient today.  Concerns regarding medicines are outlined above.  No orders of the defined types were placed in this encounter.  No orders of the defined types were placed in this encounter.    Chief Complaint  Patient presents with  . Follow-up     History of Present Illness:    Austin RecordsJames E [name] is a 58 y.o. male.  Patient has past medical history of cardiomyopathy post defibrillator insertion.  He denies any problems at this time and takes care of activities of daily living.  He is on disability.  He leads a sedentary lifestyle.   No chest pain orthopnea or PND.  He is overweight.  At the time of my evaluation, the patient is alert awake oriented and in no distress.  Past Medical History:  Diagnosis Date  . Automatic implantable cardioverter-defibrillator in situ   . Chronic bronchitis (HCC)    Take advair inhaler  . Diabetes mellitus without complication (HCC)    takes insulin  . Gout   . Hyperlipemia   . Hypertension   . Myocardial infarction (HCC)   . Pneumonia 2013  . Shortness of breath    with exertion    Past Surgical History:  Procedure Laterality Date  . CARDIAC CATHETERIZATION     stent x 2  . MULTIPLE EXTRACTIONS WITH ALVEOLOPLASTY N/A 03/16/2013   Procedure: MULTIPLE EXTRACTION (#9, 10, 11, 12, 13, 14, 15, 18, 20, 21, 22, 23, 24, 25, 26, 27) WITH ALVEOLOPLASTY;  Surgeon: Georgia LopesScott M Jensen, DDS;  Location: [facility] OR;  Service: Oral Surgery;  Laterality: N/A;  . TOE SURGERY Right    pinky toe    Current Medications: Current Meds  Medication Sig  . allopurinol (ZYLOPRIM) 300 MG tablet Take 300 mg by mouth daily.  Marland Kitchen. aspirin 325 MG tablet Take 325 mg by mouth daily.  . Cholecalciferol (VITAMIN D-1000 MAX ST) 25 MCG (1000 UT) tablet Take 1 tablet by mouth daily.  . Fluticasone-Salmeterol (ADVAIR) 100-50 MCG/DOSE AEPB Inhale 1 puff into the lungs every 12 (twelve) hours.  . furosemide (LASIX) 40 MG tablet Take 40 mg by mouth daily.   . hydrALAZINE (APRESOLINE)  25 MG tablet Take 37.5 mg by mouth 3 (three) times daily.  Marland Kitchen lisinopril (PRINIVIL,ZESTRIL) 5 MG tablet Take 5 mg by mouth daily.   . metoprolol succinate (TOPROL-XL) 25 MG 24 hr tablet TAKE 1 TABLET BY MOUTH EVERY DAY  . montelukast (SINGULAIR) 10 MG tablet Take 10 mg by mouth daily.  Marland Kitchen oxyCODONE-acetaminophen (PERCOCET) 10-325 MG per tablet Take 1-2 tablets by mouth every 4 (four) hours as needed for pain.  . simvastatin (ZOCOR) 40 MG tablet Take 40 mg by mouth daily.  . VENTOLIN HFA 108 (90 Base) MCG/ACT inhaler 12P EVERY 4 TO 6 HOURS AS NEEDED   . Vitamin D, Ergocalciferol, (DRISDOL) 1.25 MG (50000 UT) CAPS capsule TAKE 1 ORAL CAPSULE ONCE A WEEK FOR 8 WEEKS     Allergies:   Patient has no known allergies.   Social History   Socioeconomic History  . Marital status: Single    Spouse name: Not on file  . Number of children: Not on file  . Years of education: Not on file  . Highest education level: Not on file  Occupational History  . Not on file  Social Needs  . Financial resource strain: Not on file  . Food insecurity    Worry: Not on file    Inability: Not on file  . Transportation needs    Medical: Not on file    Non-medical: Not on file  Tobacco Use  . Smoking status: Former Research scientist (life sciences)  . Smokeless tobacco: Never Used  Substance and Sexual Activity  . Alcohol use: Yes    Alcohol/week: 2.0 standard drinks    Types: 2 Cans of beer per week    Comment: occasional  . Drug use: No  . Sexual activity: Not on file  Lifestyle  . Physical activity    Days per week: Not on file    Minutes per session: Not on file  . Stress: Not on file  Relationships  . Social Herbalist on phone: Not on file    Gets together: Not on file    Attends religious service: Not on file    Active member of club or organization: Not on file    Attends meetings of clubs or organizations: Not on file    Relationship status: Not on file  Other Topics Concern  . Not on file  Social History Narrative  . Not on file     Family History: The patient's family history is not on file.  ROS:   Please see the history of present illness.    All other systems reviewed and are negative.  EKGs/Labs/Other Studies Reviewed:    The following studies were reviewed today: Echocardiogram report from [facility] was reviewed this was done earlier in the year and has an ejection fraction of 20 to 25%.   Recent Labs: 11/04/2018: ALT 192; BUN 20; Creatinine, Ser 1.38; Hemoglobin 13.1; Platelets 219; Potassium 4.7; Sodium 139; TSH 1.780   Recent Lipid Panel    Component Value Date/Time   CHOL 331 (H) 11/04/2018 1019   TRIG 126 11/04/2018 1019   HDL 93 11/04/2018 1019   CHOLHDL 3.6 11/04/2018 1019   LDLCALC 213 (H) 11/04/2018 1019    Physical Exam:    VS:  BP (!) 160/102 (BP Location: Left Arm, Patient Position: Sitting, Cuff Size: Large)   Pulse 98   Ht 5\' 11"  (1.803 m)   Wt 272 lb (123.4 kg)   SpO2 98%   BMI 37.94 kg/m  Wt Readings from Last 3 Encounters:  02/09/19 272 lb (123.4 kg)  10/29/18 280 lb (127 kg)  07/23/18 273 lb (123.8 kg)     GEN: Patient is in no acute distress HEENT: Normal NECK: No JVD; No carotid bruits LYMPHATICS: No lymphadenopathy CARDIAC: Hear sounds regular, 2/6 systolic murmur at the apex. RESPIRATORY:  Clear to auscultation without rales, wheezing or rhonchi  ABDOMEN: Soft, non-tender, non-distended MUSCULOSKELETAL:  No edema; No deformity  SKIN: Warm and dry NEUROLOGIC:  Alert and oriented x 3 PSYCHIATRIC:  Normal affect   Signed, Garwin Brothers, MD  02/09/2019 3:46 PM    [facility]

## 2019-02-11 ENCOUNTER — Other Ambulatory Visit: Payer: Self-pay | Admitting: Cardiology

## 2019-03-08 ENCOUNTER — Other Ambulatory Visit: Payer: Self-pay | Admitting: Cardiology

## 2019-03-09 ENCOUNTER — Encounter: Payer: Self-pay | Admitting: Cardiology

## 2019-03-09 ENCOUNTER — Ambulatory Visit (INDEPENDENT_AMBULATORY_CARE_PROVIDER_SITE_OTHER): Payer: Medicare Other | Admitting: Cardiology

## 2019-03-09 ENCOUNTER — Other Ambulatory Visit: Payer: Self-pay

## 2019-03-09 VITALS — BP 141/92 | HR 100 | Ht 71.0 in | Wt 275.0 lb

## 2019-03-09 DIAGNOSIS — I255 Ischemic cardiomyopathy: Secondary | ICD-10-CM | POA: Diagnosis not present

## 2019-03-09 DIAGNOSIS — Z79899 Other long term (current) drug therapy: Secondary | ICD-10-CM

## 2019-03-09 DIAGNOSIS — Z95810 Presence of automatic (implantable) cardiac defibrillator: Secondary | ICD-10-CM

## 2019-03-09 LAB — CUP PACEART INCLINIC DEVICE CHECK
Battery Voltage: 3.11 V
Date Time Interrogation Session: 20201012155457
HighPow Impedance: 80 Ohm
Implantable Lead Implant Date: 20180711
Implantable Lead Location: 753860
Implantable Lead Model: 413997
Implantable Lead Serial Number: 49848420
Implantable Pulse Generator Implant Date: 20130924
Lead Channel Impedance Value: 419 Ohm
Lead Channel Pacing Threshold Amplitude: 0.6 V
Lead Channel Pacing Threshold Pulse Width: 0.4 ms
Lead Channel Sensing Intrinsic Amplitude: 7 mV
Lead Channel Setting Pacing Amplitude: 2.5 V
Lead Channel Setting Pacing Pulse Width: 0.4 ms
Lead Channel Setting Sensing Sensitivity: 0.8 mV
Pulse Gen Serial Number: 60669836

## 2019-03-09 MED ORDER — ENTRESTO 24-26 MG PO TABS: 1.0000 | ORAL_TABLET | Freq: Two times a day (BID) | ORAL | 2 refills | Status: DC

## 2019-03-09 MED ORDER — METOPROLOL SUCCINATE ER 50 MG PO TB24: 50.0000 mg | ORAL_TABLET | Freq: Every day | ORAL | 3 refills | Status: DC

## 2019-03-09 MED ORDER — ASPIRIN EC 81 MG PO TBEC: 81.0000 mg | DELAYED_RELEASE_TABLET | Freq: Every day | ORAL | 3 refills | Status: AC

## 2019-03-09 NOTE — Patient Instructions (Addendum)
Medication Instructions:  Your physician has recommended you make the following change in your medication:  1. STOP Lisinopril 2. START Entresto 24-26 mg twice a day  - start 03/11/19 3. Decrease Aspirin to 81 mg once a day 4. INCREASE Toprol to 50 mg once a day  *If you need a refill on your cardiac medications before your next appointment, please call your pharmacy*  Labwork: Your physician recommends that you return for lab work in: 2 weeks for BMET If you have labs (blood work) drawn today and your tests are completely normal, you will receive your results only by:  [place] (if you have MyChart) OR  A paper copy in the mail If you have any lab test that is abnormal or we need to change your treatment, we will call you to review the results.  Testing/Procedures: None ordered  Follow-Up: Your physician recommends that you schedule a follow-up appointment in: 3 months with Dr. Geraldo Pitter.  Remote monitoring is used to monitor your Pacemaker or ICD from home. This monitoring reduces the number of office visits required to check your device to one time per year. It allows [place] to keep an eye on the functioning of your device to ensure it is working properly. You are scheduled for a device check from home on 06/08/2019. You may send your transmission at any time that day. If you have a wireless device, the transmission will be sent automatically. After your physician reviews your transmission, you will receive a postcard with your next transmission date.  Your physician wants you to follow-up in: 1 year with Dr. Curt Bears.  You will receive a reminder letter in the mail two months in advance. If you don't receive a letter, please call our office to schedule the follow-up appointment.  Thank you for choosing [facility]!!   Trinidad Curet, RN [phone]  Any Other Special Instructions Will Be Listed Below (If Applicable).

## 2019-03-09 NOTE — Progress Notes (Signed)
Electrophysiology Office Note   Date:  03/09/2019   ID:  Austin [name], DOB Dec 16, 1960, MRN [id number]  PCP:  Austin Fusi, MD  Cardiologist: Austin [name] Primary Electrophysiologist:  Austin Lebon [name], MD    Chief Complaint: CHF   History of Present Illness: Austin [name] is a 58 y.o. male who is being seen today for the evaluation of CHF at the request of Austin [name], Austin Dubin, MD. Presenting today for electrophysiology evaluation.  He has a history of coronary artery disease status post MI, chronic systolic heart failure, hypertension, diabetes.  He has a single-chamber Biotronik ICD.  He presents for initial ICD evaluation.  Today, he denies symptoms of palpitations, chest pain, shortness of breath, orthopnea, PND, lower extremity edema, claudication, dizziness, presyncope, syncope, bleeding, or neurologic sequela. The patient is tolerating medications without difficulties.    Past Medical History:  Diagnosis Date  . Automatic implantable cardioverter-defibrillator in situ   . Chronic bronchitis (HCC)    Take advair inhaler  . Diabetes mellitus without complication (HCC)    takes insulin  . Gout   . Hyperlipemia   . Hypertension   . Myocardial infarction (HCC)   . Pneumonia 2013  . Shortness of breath    with exertion   Past Surgical History:  Procedure Laterality Date  . CARDIAC CATHETERIZATION     stent x 2  . MULTIPLE EXTRACTIONS WITH ALVEOLOPLASTY N/A 03/16/2013   Procedure: MULTIPLE EXTRACTION (#9, 10, 11, 12, 13, 14, 15, 18, 20, 21, 22, 23, 24, 25, 26, 27) WITH ALVEOLOPLASTY;  Surgeon: Austin [name], DDS;  Location: [facility] OR;  Service: Oral Surgery;  Laterality: N/A;  . TOE SURGERY Right    pinky toe     Current Outpatient Medications  Medication Sig Dispense Refill  . allopurinol (ZYLOPRIM) 300 MG tablet Take 300 mg by mouth daily.    . Cholecalciferol (VITAMIN D-1000 MAX ST) 25 MCG (1000 UT) tablet Take 1 tablet by mouth daily.    .  Fluticasone-Salmeterol (ADVAIR) 100-50 MCG/DOSE AEPB Inhale 1 puff into the lungs every 12 (twelve) hours.    . furosemide (LASIX) 40 MG tablet Take 40 mg by mouth daily.     . hydrALAZINE (APRESOLINE) 25 MG tablet Take 37.5 mg by mouth 3 (three) times daily.    . metoprolol succinate (TOPROL-XL) 25 MG 24 hr tablet TAKE 1 TABLET BY MOUTH EVERY DAY 90 tablet 1  . montelukast (SINGULAIR) 10 MG tablet Take 10 mg by mouth daily.    Marland Kitchen oxyCODONE-acetaminophen (PERCOCET) 10-325 MG per tablet Take 1-2 tablets by mouth every 4 (four) hours as needed for pain. 40 tablet 0  . simvastatin (ZOCOR) 40 MG tablet Take 40 mg by mouth daily.    . VENTOLIN HFA 108 (90 Base) MCG/ACT inhaler 12P EVERY 4 TO 6 HOURS AS NEEDED    . Vitamin D, Ergocalciferol, (DRISDOL) 1.25 MG (50000 UT) CAPS capsule TAKE 1 ORAL CAPSULE ONCE A WEEK FOR 8 WEEKS    . aspirin EC 81 MG tablet Take 1 tablet (81 mg total) by mouth daily. 90 tablet 3  . metoprolol succinate (TOPROL-XL) 50 MG 24 hr tablet Take 1 tablet (50 mg total) by mouth daily. Take with or immediately following a meal. 90 tablet 3  . [START ON 03/11/2019] sacubitril-valsartan (ENTRESTO) 24-26 MG Take 1 tablet by mouth 2 (two) times daily. 60 tablet 2   No current facility-administered medications for this visit.     Allergies:   Patient has no  known allergies.   Social History:  The patient  reports that he has quit smoking. He has never used smokeless tobacco. He reports current alcohol use of about 2.0 standard drinks of alcohol per week. He reports that he does not use drugs.   Family History:  The patient's family history includes Heart attack in his father; Heart disease in his mother.    ROS:  Please see the history of present illness.   Otherwise, review of systems is positive for none.   All other systems are reviewed and negative.    PHYSICAL EXAM: VS:  BP (!) 141/92   Pulse 100   Ht 5\' 11"  (1.803 m)   Wt 275 lb (124.7 kg)   SpO2 99%   BMI 38.35 kg/m   , BMI Body mass index is 38.35 kg/m. GEN: Well nourished, well developed, in no acute distress  HEENT: normal  Neck: no JVD, carotid bruits, or masses Cardiac: RRR; no murmurs, rubs, or gallops,no edema  Respiratory:  clear to auscultation bilaterally, normal work of breathing GI: soft, nontender, nondistended, + BS MS: no deformity or atrophy  Skin: warm and dry, device pocket is well healed Neuro:  Strength and sensation are intact Psych: euthymic mood, full affect  EKG:  EKG is not ordered today. Personal review of the ekg ordered 02/09/2019 shows sinus rhythm, PVCs, inferior infarct  Device interrogation is reviewed today in detail.  See PaceArt for details.   Recent Labs: 11/04/2018: ALT 192; BUN 20; Creatinine, Ser 1.38; Hemoglobin 13.1; Platelets 219; Potassium 4.7; Sodium 139; TSH 1.780    Lipid Panel     Component Value Date/Time   CHOL 331 (H) 11/04/2018 1019   TRIG 126 11/04/2018 1019   HDL 93 11/04/2018 1019   CHOLHDL 3.6 11/04/2018 1019   LDLCALC 213 (H) 11/04/2018 1019     Wt Readings from Last 3 Encounters:  03/09/19 275 lb (124.7 kg)  02/09/19 272 lb (123.4 kg)  10/29/18 280 lb (127 kg)      Other studies Reviewed: Additional studies/ records that were reviewed today include: TTE 07/09/18  Review of the above records today demonstrates:  Ejection fraction 20 to 25% Restrictive LV filling pattern consistent with elevated LA pressure Basal inferior, inferoseptal, anteroseptal, mid inferior inferoseptal and mid anteroseptal LV wall motion akinetic Mild mitral regurgitation   ASSESSMENT AND PLAN:  1.  Chronic systolic heart failure due to ischemic cardiomyopathy: Status post Biotronik ICD.  Currently on optimal medical therapy with lisinopril and Toprol-XL.  He did have therapy for ventricular tachycardia and ventricular fibrillation February 2019.  His blood pressure is significantly elevated today.  We Austin [name] thus switch him to [facility].  Austin [name] increase  Toprol-XL to 50 mg.  This can be adjusted further by his primary cardiologist.  ICD is functioning appropriately.  Hopefully adjusting his heart failure medications Austin [name] help with ventricular arrhythmias.  2.  Coronary artery disease: No current chest pain.  Continue with current management.  3.  Hypertension: Pressure has been significantly elevated on his last 3 checks.  Due to that, we Zeth Buday stop his lisinopril and start Entresto.  Franny Selvage increase Toprol-XL to 50 mg.   Current medicines are reviewed at length with the patient today.   The patient does not have concerns regarding his medicines.  The following changes were made today: Stop lisinopril, start Entresto, increase Toprol-XL  Labs/ tests ordered today include:  Orders Placed This Encounter  Procedures  . Basic metabolic panel   Case discussed  with referring cardiologist  Disposition:   FU with Jaklyn Alen 1 year  Signed, Drianna Chandran [name], MD  03/09/2019 3:06 PM     [facility] [address] [place] [address] [phone] (office) [phone] (fax)

## 2019-04-10 LAB — CUP PACEART REMOTE DEVICE CHECK
Date Time Interrogation Session: 20201113155122
Date Time Interrogation Session: 20201113155301
Implantable Lead Implant Date: 20180711
Implantable Lead Implant Date: 20180711
Implantable Lead Location: 753860
Implantable Lead Location: 753860
Implantable Lead Model: 413997
Implantable Lead Model: 413997
Implantable Lead Serial Number: 49848420
Implantable Lead Serial Number: 49848420
Implantable Pulse Generator Implant Date: 20130924
Implantable Pulse Generator Implant Date: 20130924
Pulse Gen Serial Number: 60669836
Pulse Gen Serial Number: 60669836

## 2019-05-26 ENCOUNTER — Other Ambulatory Visit: Payer: Self-pay

## 2019-05-26 ENCOUNTER — Inpatient Hospital Stay (HOSPITAL_COMMUNITY)
Admission: AD | Admit: 2019-05-26 | Discharge: 2019-06-04 | DRG: 177 | Disposition: A | Discharge status: Home / Self Care | Payer: Medicare Other | Source: Other Acute Inpatient Hospital | Attending: Internal Medicine | Admitting: Internal Medicine

## 2019-05-26 ENCOUNTER — Encounter (HOSPITAL_COMMUNITY): Payer: Self-pay | Admitting: Internal Medicine

## 2019-05-26 ENCOUNTER — Inpatient Hospital Stay (HOSPITAL_COMMUNITY): Payer: Medicare Other

## 2019-05-26 DIAGNOSIS — I5022 Chronic systolic (congestive) heart failure: Secondary | ICD-10-CM | POA: Diagnosis present

## 2019-05-26 DIAGNOSIS — K219 Gastro-esophageal reflux disease without esophagitis: Secondary | ICD-10-CM | POA: Diagnosis not present

## 2019-05-26 DIAGNOSIS — N1831 Chronic kidney disease, stage 3a: Secondary | ICD-10-CM | POA: Diagnosis not present

## 2019-05-26 DIAGNOSIS — E1165 Type 2 diabetes mellitus with hyperglycemia: Secondary | ICD-10-CM | POA: Diagnosis present

## 2019-05-26 DIAGNOSIS — N182 Chronic kidney disease, stage 2 (mild): Secondary | ICD-10-CM | POA: Diagnosis present

## 2019-05-26 DIAGNOSIS — I252 Old myocardial infarction: Secondary | ICD-10-CM

## 2019-05-26 DIAGNOSIS — Z79899 Other long term (current) drug therapy: Secondary | ICD-10-CM | POA: Diagnosis not present

## 2019-05-26 DIAGNOSIS — R7401 Elevation of levels of liver transaminase levels: Secondary | ICD-10-CM | POA: Diagnosis not present

## 2019-05-26 DIAGNOSIS — U071 COVID-19: Principal | ICD-10-CM | POA: Diagnosis present

## 2019-05-26 DIAGNOSIS — J1282 Pneumonia due to coronavirus disease 2019: Secondary | ICD-10-CM | POA: Diagnosis present

## 2019-05-26 DIAGNOSIS — I129 Hypertensive chronic kidney disease with stage 1 through stage 4 chronic kidney disease, or unspecified chronic kidney disease: Secondary | ICD-10-CM | POA: Diagnosis not present

## 2019-05-26 DIAGNOSIS — Z95810 Presence of automatic (implantable) cardiac defibrillator: Secondary | ICD-10-CM

## 2019-05-26 DIAGNOSIS — Z7951 Long term (current) use of inhaled steroids: Secondary | ICD-10-CM | POA: Diagnosis not present

## 2019-05-26 DIAGNOSIS — I130 Hypertensive heart and chronic kidney disease with heart failure and stage 1 through stage 4 chronic kidney disease, or unspecified chronic kidney disease: Secondary | ICD-10-CM | POA: Diagnosis present

## 2019-05-26 DIAGNOSIS — J9601 Acute respiratory failure with hypoxia: Secondary | ICD-10-CM | POA: Diagnosis present

## 2019-05-26 DIAGNOSIS — M109 Gout, unspecified: Secondary | ICD-10-CM | POA: Diagnosis not present

## 2019-05-26 DIAGNOSIS — Z9981 Dependence on supplemental oxygen: Secondary | ICD-10-CM

## 2019-05-26 DIAGNOSIS — E11649 Type 2 diabetes mellitus with hypoglycemia without coma: Secondary | ICD-10-CM | POA: Diagnosis not present

## 2019-05-26 DIAGNOSIS — T380X5A Adverse effect of glucocorticoids and synthetic analogues, initial encounter: Secondary | ICD-10-CM | POA: Diagnosis not present

## 2019-05-26 DIAGNOSIS — E875 Hyperkalemia: Secondary | ICD-10-CM | POA: Diagnosis not present

## 2019-05-26 DIAGNOSIS — Z87891 Personal history of nicotine dependence: Secondary | ICD-10-CM

## 2019-05-26 DIAGNOSIS — E1122 Type 2 diabetes mellitus with diabetic chronic kidney disease: Secondary | ICD-10-CM | POA: Diagnosis present

## 2019-05-26 DIAGNOSIS — Z8249 Family history of ischemic heart disease and other diseases of the circulatory system: Secondary | ICD-10-CM

## 2019-05-26 DIAGNOSIS — Z7982 Long term (current) use of aspirin: Secondary | ICD-10-CM | POA: Diagnosis not present

## 2019-05-26 DIAGNOSIS — R0602 Shortness of breath: Secondary | ICD-10-CM

## 2019-05-26 DIAGNOSIS — D72829 Elevated white blood cell count, unspecified: Secondary | ICD-10-CM | POA: Diagnosis not present

## 2019-05-26 DIAGNOSIS — E0822 Diabetes mellitus due to underlying condition with diabetic chronic kidney disease: Secondary | ICD-10-CM | POA: Diagnosis not present

## 2019-05-26 DIAGNOSIS — E785 Hyperlipidemia, unspecified: Secondary | ICD-10-CM | POA: Diagnosis not present

## 2019-05-26 HISTORY — DX: COVID-19: U07.1

## 2019-05-26 LAB — TYPE AND SCREEN
ABO/RH(D): AB POS
Antibody Screen: NEGATIVE

## 2019-05-26 LAB — ABO/RH: ABO/RH(D): AB POS

## 2019-05-26 LAB — D-DIMER, QUANTITATIVE: D-Dimer, Quant: 2.29 ug/mL-FEU — ABNORMAL HIGH (ref 0.00–0.50)

## 2019-05-26 LAB — COMPREHENSIVE METABOLIC PANEL
ALT: 34 U/L (ref 0–44)
AST: 33 U/L (ref 15–41)
Albumin: 2.9 g/dL — ABNORMAL LOW (ref 3.5–5.0)
Alkaline Phosphatase: 52 U/L (ref 38–126)
Anion gap: 12 (ref 5–15)
BUN: 41 mg/dL — ABNORMAL HIGH (ref 6–20)
CO2: 24 mmol/L (ref 22–32)
Calcium: 8.9 mg/dL (ref 8.9–10.3)
Chloride: 102 mmol/L (ref 98–111)
Creatinine, Ser: 1.38 mg/dL — ABNORMAL HIGH (ref 0.61–1.24)
GFR calc Af Amer: >60 mL/min (ref >60)
GFR calc non Af Amer: 56 mL/min — ABNORMAL LOW (ref >60)
Glucose, Bld: 308 mg/dL — ABNORMAL HIGH (ref 70–99)
Potassium: 4.6 mmol/L (ref 3.5–5.1)
Sodium: 138 mmol/L (ref 135–145)
Total Bilirubin: 0.8 mg/dL (ref 0.3–1.2)
Total Protein: 8.5 g/dL — ABNORMAL HIGH (ref 6.5–8.1)

## 2019-05-26 LAB — BRAIN NATRIURETIC PEPTIDE: B Natriuretic Peptide: 208.8 pg/mL — ABNORMAL HIGH (ref 0.0–100.0)

## 2019-05-26 LAB — PROCALCITONIN: Procalcitonin: 0.44 ng/mL

## 2019-05-26 LAB — HEMOGLOBIN A1C
Hgb A1c MFr Bld: 7.7 % — ABNORMAL HIGH (ref 4.8–5.6)
Mean Plasma Glucose: 174.29 mg/dL

## 2019-05-26 LAB — C-REACTIVE PROTEIN: CRP: 25.6 mg/dL — ABNORMAL HIGH (ref <1.0)

## 2019-05-26 LAB — GLUCOSE, CAPILLARY
Glucose-Capillary: 300 mg/dL — ABNORMAL HIGH (ref 70–99)
Glucose-Capillary: 284 mg/dL — ABNORMAL HIGH (ref 70–99)

## 2019-05-26 LAB — HIV ANTIBODY (ROUTINE TESTING W REFLEX): HIV Screen 4th Generation wRfx: NONREACTIVE

## 2019-05-26 MED ORDER — ALBUTEROL SULFATE HFA 108 (90 BASE) MCG/ACT IN AERS
2.0000 | INHALATION_SPRAY | Freq: Four times a day (QID) | RESPIRATORY_TRACT | Status: DC
  Administered 2019-05-26 – 2019-06-04 (×36): 2 via RESPIRATORY_TRACT
  Filled 2019-05-26 (×3): qty 6.7

## 2019-05-26 MED ORDER — METOPROLOL SUCCINATE ER 25 MG PO TB24
25.0000 mg | ORAL_TABLET | Freq: Every day | ORAL | Status: DC
  Administered 2019-05-26 – 2019-05-28 (×3): 25 mg via ORAL
  Filled 2019-05-26 (×3): qty 1

## 2019-05-26 MED ORDER — ONDANSETRON HCL 4 MG/2ML IJ SOLN: 4.0000 mg | Freq: Four times a day (QID) | INTRAMUSCULAR | Status: DC | PRN

## 2019-05-26 MED ORDER — SACUBITRIL-VALSARTAN 24-26 MG PO TABS
1.0000 | ORAL_TABLET | Freq: Every day | ORAL | Status: DC
  Filled 2019-05-26: qty 1

## 2019-05-26 MED ORDER — DEXAMETHASONE SODIUM PHOSPHATE 10 MG/ML IJ SOLN
6.0000 mg | INTRAMUSCULAR | Status: DC
  Administered 2019-05-26: 6 mg via INTRAVENOUS
  Filled 2019-05-26: qty 1

## 2019-05-26 MED ORDER — METOPROLOL SUCCINATE ER 25 MG PO TB24: 50.0000 mg | ORAL_TABLET | Freq: Every day | ORAL | Status: DC

## 2019-05-26 MED ORDER — SODIUM CHLORIDE 0.9 % IV SOLN
100.0000 mg | Freq: Every day | INTRAVENOUS | Status: AC
  Administered 2019-05-26 – 2019-05-29 (×4): 100 mg via INTRAVENOUS
  Filled 2019-05-26 (×4): qty 20

## 2019-05-26 MED ORDER — HYDRALAZINE HCL 25 MG PO TABS
37.5000 mg | ORAL_TABLET | Freq: Three times a day (TID) | ORAL | Status: DC
  Administered 2019-05-26 – 2019-06-04 (×26): 37.5 mg via ORAL
  Filled 2019-05-26: qty 2
  Filled 2019-05-26: qty 1.5
  Filled 2019-05-26 (×3): qty 2
  Filled 2019-05-26: qty 1.5
  Filled 2019-05-26 (×6): qty 2
  Filled 2019-05-26: qty 1.5
  Filled 2019-05-26 (×13): qty 2

## 2019-05-26 MED ORDER — ENOXAPARIN SODIUM 40 MG/0.4ML ~~LOC~~ SOLN
40.0000 mg | Freq: Every day | SUBCUTANEOUS | Status: DC
  Administered 2019-05-26 – 2019-06-04 (×10): 40 mg via SUBCUTANEOUS
  Filled 2019-05-26 (×10): qty 0.4

## 2019-05-26 MED ORDER — DEXAMETHASONE SODIUM PHOSPHATE 10 MG/ML IJ SOLN
6.0000 mg | Freq: Two times a day (BID) | INTRAMUSCULAR | Status: DC
  Administered 2019-05-26 – 2019-05-29 (×7): 6 mg via INTRAVENOUS
  Filled 2019-05-26 (×9): qty 1

## 2019-05-26 MED ORDER — LACTATED RINGERS IV SOLN: INTRAVENOUS | Status: AC

## 2019-05-26 MED ORDER — SODIUM CHLORIDE 0.9 % IV SOLN: 100.0000 mg | Freq: Every day | INTRAVENOUS | Status: DC

## 2019-05-26 MED ORDER — ACETAMINOPHEN 325 MG PO TABS
650.0000 mg | ORAL_TABLET | Freq: Four times a day (QID) | ORAL | Status: DC | PRN
  Administered 2019-06-03 – 2019-06-04 (×2): 650 mg via ORAL
  Filled 2019-05-26 (×2): qty 2

## 2019-05-26 MED ORDER — FUROSEMIDE 20 MG PO TABS
40.0000 mg | ORAL_TABLET | Freq: Every day | ORAL | Status: DC
  Administered 2019-05-26 – 2019-05-28 (×3): 40 mg via ORAL
  Filled 2019-05-26 (×3): qty 2

## 2019-05-26 MED ORDER — SENNOSIDES-DOCUSATE SODIUM 8.6-50 MG PO TABS
1.0000 | ORAL_TABLET | Freq: Every evening | ORAL | Status: DC | PRN
  Administered 2019-05-30: 1 via ORAL
  Filled 2019-05-26: qty 1

## 2019-05-26 MED ORDER — SODIUM CHLORIDE 0.9 % IV SOLN: 200.0000 mg | Freq: Once | INTRAVENOUS | Status: DC

## 2019-05-26 MED ORDER — PANTOPRAZOLE SODIUM 40 MG PO TBEC
40.0000 mg | DELAYED_RELEASE_TABLET | Freq: Every day | ORAL | Status: DC
  Administered 2019-05-26 – 2019-06-04 (×10): 40 mg via ORAL
  Filled 2019-05-26 (×10): qty 1

## 2019-05-26 MED ORDER — ASPIRIN EC 81 MG PO TBEC
81.0000 mg | DELAYED_RELEASE_TABLET | Freq: Every day | ORAL | Status: DC
  Administered 2019-05-26 – 2019-06-04 (×10): 81 mg via ORAL
  Filled 2019-05-26 (×10): qty 1

## 2019-05-26 MED ORDER — SODIUM CHLORIDE 0.9% FLUSH
3.0000 mL | Freq: Two times a day (BID) | INTRAVENOUS | Status: DC
  Administered 2019-05-26 – 2019-06-04 (×18): 3 mL via INTRAVENOUS

## 2019-05-26 MED ORDER — INSULIN ASPART 100 UNIT/ML ~~LOC~~ SOLN
0.0000 [IU] | Freq: Three times a day (TID) | SUBCUTANEOUS | Status: DC
  Administered 2019-05-26: 5 [IU] via SUBCUTANEOUS
  Administered 2019-05-27: 7 [IU] via SUBCUTANEOUS
  Administered 2019-05-27: 9 [IU] via SUBCUTANEOUS
  Administered 2019-05-27: 5 [IU] via SUBCUTANEOUS
  Administered 2019-05-28: 3 [IU] via SUBCUTANEOUS
  Administered 2019-05-28: 9 [IU] via SUBCUTANEOUS

## 2019-05-26 MED ORDER — SIMVASTATIN 20 MG PO TABS
40.0000 mg | ORAL_TABLET | Freq: Every day | ORAL | Status: DC
  Administered 2019-05-26 – 2019-06-04 (×10): 40 mg via ORAL
  Filled 2019-05-26 (×10): qty 2

## 2019-05-26 MED ORDER — ONDANSETRON HCL 4 MG PO TABS
4.0000 mg | ORAL_TABLET | Freq: Four times a day (QID) | ORAL | Status: DC | PRN
  Administered 2019-06-03: 4 mg via ORAL
  Filled 2019-05-26: qty 1

## 2019-05-26 MED ORDER — INSULIN ASPART 100 UNIT/ML ~~LOC~~ SOLN
0.0000 [IU] | Freq: Every day | SUBCUTANEOUS | Status: DC
  Administered 2019-05-26 – 2019-05-27 (×2): 3 [IU] via SUBCUTANEOUS

## 2019-05-26 MED ORDER — ALLOPURINOL 100 MG PO TABS
300.0000 mg | ORAL_TABLET | Freq: Every day | ORAL | Status: DC
  Administered 2019-05-26 – 2019-06-04 (×10): 300 mg via ORAL
  Filled 2019-05-26 (×10): qty 3

## 2019-05-26 NOTE — Plan of Care (Signed)

## 2019-05-26 NOTE — Significant Event (Signed)
58 year old male with CAD, prior MI, chronic systolic heart failure, HTN, DM, HLD, COPD presented to [facility] with worsening shortness of breath found hypoxic with oxygen saturation of 84% on room air, now on 4 LPM with sats in the mid 90s.  Tested positive for COVID-19 on 12/21 repeated test today still positive. -CXR bilateral groundglass opacities consistent with viral pneumonitis -CTA of the chest negative for PE, opacities consistent with viral pneumonia -Elevated D-dimer, ferritin, -Procalcitonin WNL -Decadron, Lovenox, remdesivir have been ordered -Awaiting transfer to [facility] for further management

## 2019-05-26 NOTE — H&P (Signed)
TRH H&P   Patient Demographics:    Austin [name], is a 58 y.o. male  MRN: [id number]   DOB - 17-Feb-1961  Admit Date - 05/26/2019  Outpatient Primary MD for the patient is Paulina Fusi, MD  Patient coming from: [facility] ER  CC = SOB    HPI:    Austin [name]  is a 58 y.o. male, with history of chronic systolic heart failure no echo on file, AICD placement, hypertension, dyslipidemia who lives at home and presented to [facility] ER with 3 to 4 days history of not feeling well, he was diagnosed with COVID-19 pneumonia was placed on 15 L nonrebreather oxygen for reasons unknown and then sent to [facility].  Upon arrival here he was found to be stable on 2 L nasal cannula oxygen and in no respiratory distress.  His work-up at [facility] was unremarkable and blood work appeared stable, D-dimer and lactic acid were slightly elevated, CT chest suggested COVID-19 pneumonitis.  He was transferred here for further care.  Currently patient is symptom-free at rest he has no shortness of breath, denies any headache chest or abdominal pain.  No focal weakness.    Review of systems:    A full 10 point Review of Systems was done, except as stated above, all other Review of Systems were negative.   With Past History of the following :    Past Medical History:  Diagnosis Date  . Automatic implantable cardioverter-defibrillator in situ   . Chronic bronchitis (HCC)    Take advair inhaler  . Diabetes mellitus without complication (HCC)    takes insulin  . Gout   . Hyperlipemia   . Hypertension   . Myocardial infarction (HCC)   . Pneumonia 2013  . Shortness of breath    with exertion      Past Surgical History:  Procedure  Laterality Date  . CARDIAC CATHETERIZATION     stent x 2  . MULTIPLE EXTRACTIONS WITH ALVEOLOPLASTY N/A 03/16/2013   Procedure: MULTIPLE EXTRACTION (#9, 10, 11, 12, 13, 14, 15, 18, 20, 21, 22, 23, 24, 25, 26, 27) WITH ALVEOLOPLASTY;  Surgeon: Georgia Lopes, DDS;  Location: [facility] OR;  Service: Oral Surgery;  Laterality: N/A;  . TOE SURGERY Right    pinky toe      Social History:  Social History   Tobacco Use  . Smoking status: Former Research scientist (life sciences)  . Smokeless tobacco: Never Used  Substance Use Topics  . Alcohol use: Yes    Alcohol/week: 2.0 standard drinks    Types: 2 Cans of beer per week    Comment: occasional         Family History :     Family History  Problem Relation Age of Onset  . Heart disease Mother   . Heart attack Father        Home Medications:   Prior to Admission medications   Medication Sig Start Date End Date Taking? Authorizing Provider  allopurinol (ZYLOPRIM) 300 MG tablet Take 300 mg by mouth daily.   Yes [provider]  aspirin EC 81 MG tablet Take 1 tablet (81 mg total) by mouth daily. 03/09/19  Yes Camnitz, Will Hassell Done, MD  esomeprazole (NEXIUM) 40 MG capsule Take 40 mg by mouth daily at 12 noon.   Yes [provider]  Fluticasone-Salmeterol (ADVAIR) 100-50 MCG/DOSE AEPB Inhale 1 puff into the lungs every 12 (twelve) hours.   Yes [provider]  furosemide (LASIX) 40 MG tablet Take 40 mg by mouth daily.    Yes [provider]  hydrALAZINE (APRESOLINE) 25 MG tablet Take 37.5 mg by mouth 3 (three) times daily.   Yes [provider]  metoprolol succinate (TOPROL-XL) 50 MG 24 hr tablet Take 1 tablet (50 mg total) by mouth daily. Take with or immediately following a meal. 03/09/19 06/07/19 Yes Camnitz, Will Hassell Done, MD  sacubitril-valsartan (ENTRESTO) 24-26 MG Take 1 tablet by mouth 2 (two) times daily. 03/11/19  Yes Camnitz, Will Hassell Done, MD  simvastatin (ZOCOR) 40 MG tablet Take 40 mg by mouth daily.   Yes  [provider]  VENTOLIN HFA 108 (90 Base) MCG/ACT inhaler Inhale 1-2 puffs into the lungs every 4 (four) hours as needed for wheezing or shortness of breath.  11/20/18  Yes [provider]  Vitamin D, Ergocalciferol, (DRISDOL) 1.25 MG (50000 UT) CAPS capsule Take 50,000 Units by mouth every 7 (seven) days.  12/01/18  Yes [provider]  metoprolol succinate (TOPROL-XL) 25 MG 24 hr tablet TAKE 1 TABLET BY MOUTH EVERY DAY Patient not taking: Reported on 05/26/2019 03/10/19   Revankar, Reita Cliche, MD     Allergies:    No Known Allergies   Physical Exam:   Vitals  Blood pressure (!) 132/92, pulse 85, temperature 98.1 F (36.7 C), temperature source Oral, resp. rate 18, height 5\' 11"  (1.803 m), weight 86.2 kg, SpO2 91 %.   1. General middle-aged obese African-American male lying in hospital bed in no apparent distress,  2. Normal affect and insight, Not Suicidal or Homicidal, Awake Alert, Oriented X 3.  3. No F.N deficits, ALL C.Nerves Intact, Strength 5/5 all 4 extremities, Sensation intact all 4 extremities, Plantars down going.  4. Ears and Eyes appear Normal, Conjunctivae clear, PERRLA. Moist Oral Mucosa.  5. Supple Neck, No JVD, No cervical lymphadenopathy appriciated, No Carotid Bruits.  6. Symmetrical Chest wall movement, Good air movement bilaterally, CTAB.  7. RRR, No Gallops, Rubs or Murmurs, No Parasternal Heave.  8. Positive Bowel Sounds, Abdomen Soft, No tenderness, No organomegaly appriciated,No rebound -guarding or rigidity.  9.  No Cyanosis, Normal Skin Turgor, No Skin Rash or Bruise.  10. Good muscle tone,  joints appear normal , no effusions, Normal ROM.  11. No Palpable Lymph Nodes in Neck or Axillae      Data Review:  CBC No results for input(s): WBC, HGB, HCT, PLT, MCV, MCH, MCHC, RDW, LYMPHSABS, MONOABS, EOSABS, BASOSABS, BANDABS in the last 168 hours.  Invalid input(s): NEUTRABS,  BANDSABD ------------------------------------------------------------------------------------------------------------------  Chemistries  No results for input(s): NA, K, CL, CO2, GLUCOSE, BUN, CREATININE, CALCIUM, MG, AST, ALT, ALKPHOS, BILITOT in the last 168 hours.  Invalid input(s): GFRCGP ------------------------------------------------------------------------------------------------------------------ CrCl cannot be calculated (Patient's most recent lab result is older than the maximum 21 days allowed.). ------------------------------------------------------------------------------------------------------------------ No results for input(s): TSH, T4TOTAL, T3FREE, THYROIDAB in the last 72 hours.  Invalid input(s): FREET3  Coagulation profile No results for input(s): INR, PROTIME in the last 168 hours. ------------------------------------------------------------------------------------------------------------------- No results for input(s): DDIMER in the last 72 hours. -------------------------------------------------------------------------------------------------------------------  Cardiac Enzymes No results for input(s): CKMB, TROPONINI, MYOGLOBIN in the last 168 hours.  Invalid input(s): CK ------------------------------------------------------------------------------------------------------------------ No results found for: BNP  ----------------------------------------------------------------------------------------------------------------   Imaging Results:        Assessment & Plan:     1.  COVID-19 pneumonia with mild acute hypoxic respiratory failure.  He appears to have mild to moderate disease, currently stable on 2 L nasal cannula oxygen, will be placed on IV steroids and remdesivir.  In case his pulmonary status worsens he has consented for off label Actemra use.  Encouraged to sit up in chair and daytime use flutter valve and I-S for pulmonary toiletry and prone in  bed at night.  Actemra off label use - patient was told that if COVID-19 pneumonitis gets worse we might potentially use Actemra off label, she denies any known history of tuberculosis or hepatitis, understands the risks and benefits and wants to proceed with Actemra treatment if required.  2.  Chronic systolic heart failure.  No echo on file, AICD placement.  Currently stable will be continued on beta-blocker, Entresto and hydralazine combination.  Clinically compensated, currently not on diuretics.  Evaluate daily.  3.  Dyslipidemia.  Continue statin.  4.  GERD.  On PPI.    DVT Prophylaxis  ovenox    AM Labs Ordered, also please review Full Orders  Family Communication: Admission, patients condition and plan of care including tests being ordered have been discussed with the patient who indicates understanding and agree with the plan and Code Status.  Code Status Full  Likely DC to  Home  Condition - Fair  Consults called: None    Admission status: Inpt    Time spent in minutes : 35   Susa Raring M.D on 05/26/2019 at 11:25 AM  To page go to [email or web address] - password [facility]

## 2019-05-26 NOTE — Progress Notes (Signed)
[place] for Remdesivir Indication: COVID 19 PNA  No Known Allergies  Patient Measurements: Height: 5\' 11"  (180.3 cm) Weight: 190 lb (86.2 kg) IBW/kg (Calculated) : 75.3  Vital Signs: Temp: 98.1 F (36.7 C) (12/29 0959) Temp Source: Oral (12/29 1020) BP: 132/92 (12/29 0959) Pulse Rate: 85 (12/29 0959) Intake/Output from previous day: No intake/output data recorded. Intake/Output from this shift: No intake/output data recorded.  Labs:  From [facility] 12/29 ALT 43, SCr 1.7, Hgb 14.7, PLTC 329, CRP 250.6   Medications:   From [place] 200mg  IV x 1 12/28 at 2245 Dexamethasone 6mg  IV  x 1 12/28 at 2245 Lovenox 40mg  SQ  x 1 12/28 at 2245 Azithro 500mg  IV x 1 12/29 at 0120  Assessment: 58 yo M transferred from [facility] for management of COVID PNA.  I called [facility] to obtain medications administered and baseline labs as documented above.   Plan:  Remdesivir 100mg  IV q24h x 4 doses Lovenox 40mg  SQ q24h  [employer], Pharm.D., BCPS Clinical Pharmacist Clinical phone for 05/26/2019 from 8:30-4:00 is [phone].  **Pharmacist phone directory can be found on [email or web address] listed under [place].  05/26/2019 10:41 AM

## 2019-05-27 DIAGNOSIS — E0822 Diabetes mellitus due to underlying condition with diabetic chronic kidney disease: Secondary | ICD-10-CM

## 2019-05-27 DIAGNOSIS — N182 Chronic kidney disease, stage 2 (mild): Secondary | ICD-10-CM

## 2019-05-27 DIAGNOSIS — J9601 Acute respiratory failure with hypoxia: Secondary | ICD-10-CM

## 2019-05-27 DIAGNOSIS — I5022 Chronic systolic (congestive) heart failure: Secondary | ICD-10-CM

## 2019-05-27 DIAGNOSIS — I129 Hypertensive chronic kidney disease with stage 1 through stage 4 chronic kidney disease, or unspecified chronic kidney disease: Secondary | ICD-10-CM

## 2019-05-27 HISTORY — DX: Acute respiratory failure with hypoxia: J96.01

## 2019-05-27 LAB — COMPREHENSIVE METABOLIC PANEL
ALT: 31 U/L (ref 0–44)
AST: 33 U/L (ref 15–41)
Albumin: 2.8 g/dL — ABNORMAL LOW (ref 3.5–5.0)
Alkaline Phosphatase: 51 U/L (ref 38–126)
Anion gap: 10 (ref 5–15)
BUN: 44 mg/dL — ABNORMAL HIGH (ref 6–20)
CO2: 24 mmol/L (ref 22–32)
Calcium: 8.6 mg/dL — ABNORMAL LOW (ref 8.9–10.3)
Chloride: 100 mmol/L (ref 98–111)
Creatinine, Ser: 1.29 mg/dL — ABNORMAL HIGH (ref 0.61–1.24)
GFR calc Af Amer: >60 mL/min (ref >60)
GFR calc non Af Amer: >60 mL/min (ref >60)
Glucose, Bld: 258 mg/dL — ABNORMAL HIGH (ref 70–99)
Potassium: 4.6 mmol/L (ref 3.5–5.1)
Sodium: 134 mmol/L — ABNORMAL LOW (ref 135–145)
Total Bilirubin: 1 mg/dL (ref 0.3–1.2)
Total Protein: 8.3 g/dL — ABNORMAL HIGH (ref 6.5–8.1)

## 2019-05-27 LAB — HEMOGLOBIN A1C
Hgb A1c MFr Bld: 7.7 % — ABNORMAL HIGH (ref 4.8–5.6)
Mean Plasma Glucose: 174.29 mg/dL

## 2019-05-27 LAB — CBC WITH DIFFERENTIAL/PLATELET
Abs Immature Granulocytes: 0.09 10*3/uL — ABNORMAL HIGH (ref 0.00–0.07)
Basophils Absolute: 0 10*3/uL (ref 0.0–0.1)
Basophils Relative: 0 %
Eosinophils Absolute: 0 10*3/uL (ref 0.0–0.5)
Eosinophils Relative: 0 %
HCT: 43.4 % (ref 39.0–52.0)
Hemoglobin: 13.8 g/dL (ref 13.0–17.0)
Immature Granulocytes: 1 %
Lymphocytes Relative: 8 %
Lymphs Abs: 0.9 10*3/uL (ref 0.7–4.0)
MCH: 30.2 pg (ref 26.0–34.0)
MCHC: 31.8 g/dL (ref 30.0–36.0)
MCV: 95 fL (ref 80.0–100.0)
Monocytes Absolute: 0.2 10*3/uL (ref 0.1–1.0)
Monocytes Relative: 2 %
Neutro Abs: 9.6 10*3/uL — ABNORMAL HIGH (ref 1.7–7.7)
Neutrophils Relative %: 89 %
Platelets: 329 10*3/uL (ref 150–400)
RBC: 4.57 MIL/uL (ref 4.22–5.81)
RDW: 13.7 % (ref 11.5–15.5)
WBC: 10.9 10*3/uL — ABNORMAL HIGH (ref 4.0–10.5)
nRBC: 0 % (ref 0.0–0.2)

## 2019-05-27 LAB — GLUCOSE, CAPILLARY
Glucose-Capillary: 324 mg/dL — ABNORMAL HIGH (ref 70–99)
Glucose-Capillary: 351 mg/dL — ABNORMAL HIGH (ref 70–99)
Glucose-Capillary: 277 mg/dL — ABNORMAL HIGH (ref 70–99)
Glucose-Capillary: 264 mg/dL — ABNORMAL HIGH (ref 70–99)

## 2019-05-27 LAB — C-REACTIVE PROTEIN: CRP: 19.8 mg/dL — ABNORMAL HIGH (ref <1.0)

## 2019-05-27 LAB — MAGNESIUM: Magnesium: 2.9 mg/dL — ABNORMAL HIGH (ref 1.7–2.4)

## 2019-05-27 LAB — D-DIMER, QUANTITATIVE: D-Dimer, Quant: 1.46 ug/mL-FEU — ABNORMAL HIGH (ref 0.00–0.50)

## 2019-05-27 LAB — BRAIN NATRIURETIC PEPTIDE: B Natriuretic Peptide: 246.8 pg/mL — ABNORMAL HIGH (ref 0.0–100.0)

## 2019-05-27 MED ORDER — INSULIN DETEMIR 100 UNIT/ML ~~LOC~~ SOLN
10.0000 [IU] | Freq: Two times a day (BID) | SUBCUTANEOUS | Status: DC
  Administered 2019-05-27 (×2): 10 [IU] via SUBCUTANEOUS
  Filled 2019-05-27 (×4): qty 0.1

## 2019-05-27 NOTE — Progress Notes (Signed)
Inpatient Diabetes Program Recommendations  AACE/ADA: New Consensus Statement on Inpatient Glycemic Control (2015)  Target Ranges:  Prepandial:   less than 140 mg/dL      Peak postprandial:   less than 180 mg/dL (1-2 hours)      Critically ill patients:  140 - 180 mg/dL   Lab Results  Component Value Date   LFYBOF 751 (H) 05/27/2019   HGBA1C 7.7 (H) 05/27/2019    Review of Glycemic Control  Diabetes history: DM2 Outpatient Diabetes medications: None Current orders for Inpatient glycemic control: Levemir 10 units bid, Novolog 0-9 units tidwc and 0-5 units QHS  HgbA1C - 7.7% Blood sugars above goal of < 180 mg/dL.  Inpatient Diabetes Program Recommendations:     Increase Novolog to 0-20 units tidwc and 0-5 units QHS  Will follow glucose trends.  Thank you. Lorenda Peck, RD, LDN, CDE Inpatient Diabetes Coordinator [phone]

## 2019-05-27 NOTE — Progress Notes (Signed)
[facility] PROGRESS NOTE    Progress Note  [name]  YQI:[id number] DOB: Aug 05, 1960 DOA: 05/26/2019 PCP: Paulina Fusi, MD     Brief Narrative:   [name] is an 58 y.o. male past medical history of chronic systolic heart failure, AICD, essential hypertension lives at home, relates that he has not been feeling well for 4 days prior to admission went to [facility] ED was found to be hypoxic with improvement in his oxygenation with 15 L of high flow nasal cannula.  Chest x-ray showed bilateral infiltrate and SARS-CoV-2 PCR was positive.  He was started empirically on IV remdesivir and steroids  Assessment/Plan:   Acute respiratory failure with hypoxia secondary to pneumonia due to COVID-19; He is requiring 3 L of oxygen to keep saturations greater than 92%. We will start empirically on IV remdesivir and steroids.  His inflammatory markers have significantly improved, his CRP today is 19 and D-dimer 1.4. Try to keep the patient peripherally 16 hours a day, if not prone out of bed to chair. Strict I's and O's Daily weights.  Chronic systolic heart failure: Unknown EF, AICD in place suspect less than 30%. Creatinine and blood pressure seems to be stable continue beta-blocker, Entresto and hydralazine. He appears to be compensated on physical exam.  Benign hypertension with chronic kidney disease, stage II Seems to be well controlled.  Continue to monitor.  Uncontrolled diabetes mellitus type 2: With an A1c of 7.7, he was started on sliding scale insulin, his blood glucose have been greater than 300. We will start him on resistant scale and long-acting insulin.  Dyslipidemia Continue statin therapy.  GERD: Continue PPI.   DVT prophylaxis: lovenox Family Communication: none Disposition Plan/Barrier to D/C: unable to determine Code Status:     Code Status Orders  (From admission, onward)         Start     Ordered   05/26/19 0956  Full code   Continuous     05/26/19 0957        Code Status History    This patient has a current code status but no historical code status.   Advance Care Planning Activity        IV Access:    Peripheral IV   Procedures and diagnostic studies:   DG Chest Port 1 View  Result Date: 05/26/2019 CLINICAL DATA:  Dyspnea EXAM: PORTABLE CHEST 1 VIEW COMPARISON:  Chest radiograph from one day prior. FINDINGS: Two lead left subclavian ICD is stable in configuration. Stable cardiomediastinal silhouette with mild cardiomegaly. No pneumothorax. No pleural effusion. Extensive patchy parahilar and lower lung opacities bilaterally, worsened. IMPRESSION: Cardiomegaly. Worsening extensive patchy parahilar and lower lung opacities bilaterally, favor cardiogenic pulmonary edema due to heart failure given the rapid change, with pneumonia not excluded. Electronically Signed   By: Delbert Phenix M.D.   On: 05/26/2019 14:51     Medical Consultants:    None.  Anti-Infectives:  IV remdesivir  Subjective:    [name] he relates no improvement in his oxygen saturation.  Objective:    Vitals:   05/26/19 2000 05/27/19 0000 05/27/19 0400 05/27/19 0814  BP: 115/79 116/87 (!) 128/91   Pulse: 91 84 73 100  Resp: (!) 22 (!) 22 17 (!) 26  Temp: 98.5 F (36.9 C) 98.8 F (37.1 C) 98.6 F (37 C) 97.8 F (36.6 C)  TempSrc: Oral Oral Axillary Oral  SpO2: 94% 96% 93%   Weight:      Height:  SpO2: 93 % O2 Flow Rate (L/min): 3 L/min   Intake/Output Summary (Last 24 hours) at 05/27/2019 0845 Last data filed at 05/27/2019 0600 Gross per 24 hour  Intake 920 ml  Output 900 ml  Net 20 ml   Filed Weights   05/26/19 1020  Weight: 86.2 kg    Exam: General exam: In no acute distress. Respiratory system: Good air movement and clear to auscultation. Cardiovascular system: S1 & S2 heard, RRR. No JVD. Gastrointestinal system: Abdomen is nondistended, soft and nontender.  Central nervous  system: Alert and oriented. No focal neurological deficits. Extremities: No pedal edema. Skin: No rashes, lesions or ulcers Psychiatry: Judgement and insight appear normal. Mood & affect appropriate.   Data Reviewed:    Labs: Basic Metabolic Panel: Recent Labs  Lab 05/26/19 1030 05/27/19 0307  NA 138 134*  K 4.6 4.6  CL 102 100  CO2 24 24  GLUCOSE 308* 258*  BUN 41* 44*  CREATININE 1.38* 1.29*  CALCIUM 8.9 8.6*  MG  --  2.9*   GFR Estimated Creatinine Clearance: 65.7 mL/min (A) (by C-G formula based on SCr of 1.29 mg/dL (H)). Liver Function Tests: Recent Labs  Lab 05/26/19 1030 05/27/19 0307  AST 33 33  ALT 34 31  ALKPHOS 52 51  BILITOT 0.8 1.0  PROT 8.5* 8.3*  ALBUMIN 2.9* 2.8*   No results for input(s): LIPASE, AMYLASE in the last 168 hours. No results for input(s): AMMONIA in the last 168 hours. Coagulation profile No results for input(s): INR, PROTIME in the last 168 hours. COVID-19 Labs  Recent Labs    05/26/19 1030 05/27/19 0307  DDIMER 2.29* 1.46*  CRP 25.6* 19.8*    No results found for: SARSCOV2NAA  CBC: Recent Labs  Lab 05/27/19 0307  WBC 10.9*  NEUTROABS 9.6*  HGB 13.8  HCT 43.4  MCV 95.0  PLT 329   Cardiac Enzymes: No results for input(s): CKTOTAL, CKMB, CKMBINDEX, TROPONINI in the last 168 hours. BNP (last 3 results) No results for input(s): PROBNP in the last 8760 hours. CBG: Recent Labs  Lab 05/26/19 1600 05/26/19 2014 05/27/19 0754  GLUCAP 300* 284* 324*   D-Dimer: Recent Labs    05/26/19 1030 05/27/19 0307  DDIMER 2.29* 1.46*   Hgb A1c: Recent Labs    05/26/19 1606  HGBA1C 7.7*   Lipid Profile: No results for input(s): CHOL, HDL, LDLCALC, TRIG, CHOLHDL, LDLDIRECT in the last 72 hours. Thyroid function studies: No results for input(s): TSH, T4TOTAL, T3FREE, THYROIDAB in the last 72 hours.  Invalid input(s): FREET3 Anemia work up: No results for input(s): VITAMINB12, FOLATE, FERRITIN, TIBC, IRON,  RETICCTPCT in the last 72 hours. Sepsis Labs: Recent Labs  Lab 05/26/19 1030 05/27/19 0307  PROCALCITON 0.44  --   WBC  --  10.9*   Microbiology No results found for this or any previous visit (from the past 240 hour(s)).   Medications:   . albuterol  2 puff Inhalation Q6H  . allopurinol  300 mg Oral Daily  . aspirin EC  81 mg Oral Daily  . dexamethasone (DECADRON) injection  6 mg Intravenous Q12H  . enoxaparin (LOVENOX) injection  40 mg Subcutaneous Daily  . furosemide  40 mg Oral Daily  . hydrALAZINE  37.5 mg Oral TID  . insulin aspart  0-5 Units Subcutaneous QHS  . insulin aspart  0-9 Units Subcutaneous TID WC  . metoprolol succinate  25 mg Oral Daily  . pantoprazole  40 mg Oral Daily  . simvastatin  40 mg Oral Daily  . sodium chloride flush  3 mL Intravenous Q12H   Continuous Infusions: . remdesivir 100 mg in NS 100 mL Stopped (05/26/19 1900)      LOS: 1 day   Marinda Elk  [facility]  05/27/2019, 8:45 AM

## 2019-05-28 LAB — CBC WITH DIFFERENTIAL/PLATELET
Abs Immature Granulocytes: 0.15 10*3/uL — ABNORMAL HIGH (ref 0.00–0.07)
Basophils Absolute: 0 10*3/uL (ref 0.0–0.1)
Basophils Relative: 0 %
Eosinophils Absolute: 0 10*3/uL (ref 0.0–0.5)
Eosinophils Relative: 0 %
HCT: 47.9 % (ref 39.0–52.0)
Hemoglobin: 15 g/dL (ref 13.0–17.0)
Immature Granulocytes: 1 %
Lymphocytes Relative: 6 %
Lymphs Abs: 0.7 10*3/uL (ref 0.7–4.0)
MCH: 29.7 pg (ref 26.0–34.0)
MCHC: 31.3 g/dL (ref 30.0–36.0)
MCV: 94.9 fL (ref 80.0–100.0)
Monocytes Absolute: 0.3 10*3/uL (ref 0.1–1.0)
Monocytes Relative: 2 %
Neutro Abs: 11.3 10*3/uL — ABNORMAL HIGH (ref 1.7–7.7)
Neutrophils Relative %: 91 %
Platelets: 334 10*3/uL (ref 150–400)
RBC: 5.05 MIL/uL (ref 4.22–5.81)
RDW: 13.6 % (ref 11.5–15.5)
WBC: 12.5 10*3/uL — ABNORMAL HIGH (ref 4.0–10.5)
nRBC: 0.2 % (ref 0.0–0.2)

## 2019-05-28 LAB — COMPREHENSIVE METABOLIC PANEL
ALT: 39 U/L (ref 0–44)
AST: 47 U/L — ABNORMAL HIGH (ref 15–41)
Albumin: 2.9 g/dL — ABNORMAL LOW (ref 3.5–5.0)
Alkaline Phosphatase: 52 U/L (ref 38–126)
Anion gap: 15 (ref 5–15)
BUN: 49 mg/dL — ABNORMAL HIGH (ref 6–20)
CO2: 22 mmol/L (ref 22–32)
Calcium: 8.9 mg/dL (ref 8.9–10.3)
Chloride: 98 mmol/L (ref 98–111)
Creatinine, Ser: 1.36 mg/dL — ABNORMAL HIGH (ref 0.61–1.24)
GFR calc Af Amer: >60 mL/min (ref >60)
GFR calc non Af Amer: 57 mL/min — ABNORMAL LOW (ref >60)
Glucose, Bld: 256 mg/dL — ABNORMAL HIGH (ref 70–99)
Potassium: 5.2 mmol/L — ABNORMAL HIGH (ref 3.5–5.1)
Sodium: 135 mmol/L (ref 135–145)
Total Bilirubin: 1.1 mg/dL (ref 0.3–1.2)
Total Protein: 7.9 g/dL (ref 6.5–8.1)

## 2019-05-28 LAB — BASIC METABOLIC PANEL
Anion gap: 9 (ref 5–15)
BUN: 46 mg/dL — ABNORMAL HIGH (ref 6–20)
CO2: 22 mmol/L (ref 22–32)
Calcium: 8.9 mg/dL (ref 8.9–10.3)
Chloride: 107 mmol/L (ref 98–111)
Creatinine, Ser: 1.58 mg/dL — ABNORMAL HIGH (ref 0.61–1.24)
GFR calc Af Amer: 55 mL/min — ABNORMAL LOW (ref >60)
GFR calc non Af Amer: 47 mL/min — ABNORMAL LOW (ref >60)
Glucose, Bld: 289 mg/dL — ABNORMAL HIGH (ref 70–99)
Potassium: 4.4 mmol/L (ref 3.5–5.1)
Sodium: 138 mmol/L (ref 135–145)

## 2019-05-28 LAB — GLUCOSE, CAPILLARY
Glucose-Capillary: 229 mg/dL — ABNORMAL HIGH (ref 70–99)
Glucose-Capillary: 352 mg/dL — ABNORMAL HIGH (ref 70–99)
Glucose-Capillary: 358 mg/dL — ABNORMAL HIGH (ref 70–99)

## 2019-05-28 LAB — D-DIMER, QUANTITATIVE: D-Dimer, Quant: 2.29 ug/mL-FEU — ABNORMAL HIGH (ref 0.00–0.50)

## 2019-05-28 LAB — MAGNESIUM: Magnesium: 2.6 mg/dL — ABNORMAL HIGH (ref 1.7–2.4)

## 2019-05-28 LAB — BRAIN NATRIURETIC PEPTIDE: B Natriuretic Peptide: 200.5 pg/mL — ABNORMAL HIGH (ref 0.0–100.0)

## 2019-05-28 LAB — C-REACTIVE PROTEIN: CRP: 9.1 mg/dL — ABNORMAL HIGH (ref <1.0)

## 2019-05-28 MED ORDER — INSULIN DETEMIR 100 UNIT/ML ~~LOC~~ SOLN
50.0000 [IU] | Freq: Two times a day (BID) | SUBCUTANEOUS | Status: DC
  Administered 2019-05-28: 50 [IU] via SUBCUTANEOUS
  Filled 2019-05-28 (×2): qty 0.5

## 2019-05-28 MED ORDER — INSULIN ASPART 100 UNIT/ML ~~LOC~~ SOLN
0.0000 [IU] | Freq: Three times a day (TID) | SUBCUTANEOUS | Status: DC
  Administered 2019-05-28: 20 [IU] via SUBCUTANEOUS
  Administered 2019-05-29 (×3): 4 [IU] via SUBCUTANEOUS
  Administered 2019-05-30 – 2019-05-31 (×2): 3 [IU] via SUBCUTANEOUS
  Administered 2019-06-01: 11 [IU] via SUBCUTANEOUS

## 2019-05-28 MED ORDER — INSULIN DETEMIR 100 UNIT/ML ~~LOC~~ SOLN
25.0000 [IU] | Freq: Two times a day (BID) | SUBCUTANEOUS | Status: DC
  Administered 2019-05-28: 25 [IU] via SUBCUTANEOUS
  Filled 2019-05-28 (×2): qty 0.25

## 2019-05-28 MED ORDER — INSULIN ASPART 100 UNIT/ML ~~LOC~~ SOLN: 0.0000 [IU] | Freq: Every day | SUBCUTANEOUS | Status: DC

## 2019-05-28 MED ORDER — SODIUM POLYSTYRENE SULFONATE 15 GM/60ML PO SUSP
15.0000 g | Freq: Once | ORAL | Status: AC
  Administered 2019-05-28: 15 g via ORAL
  Filled 2019-05-28: qty 60

## 2019-05-28 MED ORDER — INSULIN ASPART 100 UNIT/ML ~~LOC~~ SOLN: 0.0000 [IU] | Freq: Three times a day (TID) | SUBCUTANEOUS | Status: DC

## 2019-05-28 MED ORDER — INSULIN DETEMIR 100 UNIT/ML ~~LOC~~ SOLN
35.0000 [IU] | Freq: Two times a day (BID) | SUBCUTANEOUS | Status: DC
  Filled 2019-05-28: qty 0.35

## 2019-05-28 MED ORDER — INSULIN ASPART 100 UNIT/ML ~~LOC~~ SOLN
6.0000 [IU] | Freq: Three times a day (TID) | SUBCUTANEOUS | Status: DC
  Administered 2019-05-28 – 2019-05-29 (×2): 6 [IU] via SUBCUTANEOUS

## 2019-05-28 NOTE — Progress Notes (Signed)
[facility] PROGRESS NOTE    Progress Note  [name]  EQA:[id number] DOB: Dec 10, 1960 DOA: 05/26/2019 PCP: Austin Fusi, MD     Brief Narrative:   [name] is an 58 y.o. male past medical history of chronic systolic heart failure, AICD, essential hypertension lives at home, relates that he has not been feeling well for 4 days prior to admission went to [name] was found to be hypoxic with improvement in his oxygenation with 15 L of high flow nasal cannula.  Chest x-ray showed bilateral infiltrate and SARS-CoV-2 PCR was positive.  He was started empirically on IV remdesivir and steroids  Assessment/Plan:   Acute respiratory failure with hypoxia secondary to pneumonia due to COVID-19; Still requiring 3 to 4 L of oxygen to keep saturations greater than 94%. He was started empirically on IV remdesivir and steroids, Laboratory markers are slowly improving. He relates his breathing is better compared to yesterday, try to keep the patient prone for 16 hours a day, if not prone out of bed to chair, continues into spirometry.  Chronic systolic heart failure: Unknown EF, AICD in place suspect less than 30%. Creatinine and blood pressure seems to be stable continue beta-blocker and hydralazine.  Sherryll Burger has been held He appears to be compensated on physical exam.  Chronic kidney disease stage IIIa: Creatinine appears to be at baseline, will continue current medications and monitor closely.  New hyperkalemia: He is currently on no supplementations his creatinine is remained stable, currently on Lasix, will recheck this afternoon we will give him oral Kayexalate.  Uncontrolled diabetes mellitus type 2: With an A1c of 7.7, his blood glucoses remain persistently greater than 300 will increase long-acting insulin continue resistant sliding scale.  Dyslipidemia Continue statin therapy.  GERD: Continue PPI.  Watery stool: He is on kayexelate. Mild leukocytosis,  creatinine at baseline and has remained afebrile. Cont to monitor closely.   DVT prophylaxis: lovenox Family Communication: none Disposition Plan/Barrier to D/C: unable to determine Code Status:     Code Status Orders  (From admission, onward)         Start     Ordered   05/26/19 0956  Full code  Continuous     05/26/19 0957        Code Status History    This patient has a current code status but no historical code status.   Advance Care Planning Activity        IV Access:    Peripheral IV   Procedures and diagnostic studies:   DG Chest Port 1 View  Result Date: 05/26/2019 CLINICAL DATA:  Dyspnea EXAM: PORTABLE CHEST 1 VIEW COMPARISON:  Chest radiograph from one day prior. FINDINGS: Two lead left subclavian ICD is stable in configuration. Stable cardiomediastinal silhouette with mild cardiomegaly. No pneumothorax. No pleural effusion. Extensive patchy parahilar and lower lung opacities bilaterally, worsened. IMPRESSION: Cardiomegaly. Worsening extensive patchy parahilar and lower lung opacities bilaterally, favor cardiogenic pulmonary edema due to heart failure given the rapid change, with pneumonia not excluded. Electronically Signed   By: Delbert Phenix M.D.   On: 05/26/2019 14:51     Medical Consultants:    None.  Anti-Infectives:  IV remdesivir  Subjective:    [name] he relates his breathing is slightly better compared to yesterday.  Objective:    Vitals:   05/27/19 2200 05/27/19 2241 05/28/19 0329 05/28/19 0727  BP:  (!) 142/84 (!) 142/98 132/87  Pulse:   78 81  Resp: 20  (!)  21 18  Temp:   (!) 97 F (36.1 C) 97.8 F (36.6 C)  TempSrc:   Oral Oral  SpO2: 94%  95% 95%  Weight:      Height:       SpO2: 95 % O2 Flow Rate (L/min): 4 L/min FiO2 (%): (!) 3 %   Intake/Output Summary (Last 24 hours) at 05/28/2019 0829 Last data filed at 05/28/2019 0345 Gross per 24 hour  Intake 3 ml  Output 870 ml  Net -867 ml   Filed Weights    05/26/19 1020  Weight: 86.2 kg    Exam: General exam: In no acute distress. Respiratory system: Good air movement and diffuse crackles bilaterally. Cardiovascular system: S1 & S2 heard, RRR. No JVD. Gastrointestinal system: Abdomen is nondistended, soft and nontender.  Central nervous system: Alert and oriented. No focal neurological deficits. Extremities: No pedal edema. Skin: No rashes, lesions or ulcers Psychiatry: Judgement and insight appear normal. Mood & affect appropriate.    Data Reviewed:    Labs: Basic Metabolic Panel: Recent Labs  Lab 05/26/19 1030 05/27/19 0307 05/28/19 0545  NA 138 134* 135  K 4.6 4.6 5.2*  CL 102 100 98  CO2 24 24 22   GLUCOSE 308* 258* 256*  BUN 41* 44* 49*  CREATININE 1.38* 1.29* 1.36*  CALCIUM 8.9 8.6* 8.9  MG  --  2.9* 2.6*   GFR Estimated Creatinine Clearance: 62.3 mL/min (A) (by C-G formula based on SCr of 1.36 mg/dL (H)). Liver Function Tests: Recent Labs  Lab 05/26/19 1030 05/27/19 0307 05/28/19 0545  AST 33 33 47*  ALT 34 31 39  ALKPHOS 52 51 52  BILITOT 0.8 1.0 1.1  PROT 8.5* 8.3* 7.9  ALBUMIN 2.9* 2.8* 2.9*   No results for input(s): LIPASE, AMYLASE in the last 168 hours. No results for input(s): AMMONIA in the last 168 hours. Coagulation profile No results for input(s): INR, PROTIME in the last 168 hours. COVID-19 Labs  Recent Labs    05/26/19 1030 05/27/19 0307 05/28/19 0545  DDIMER 2.29* 1.46* 2.29*  CRP 25.6* 19.8*  --     No results found for: SARSCOV2NAA  CBC: Recent Labs  Lab 05/27/19 0307 05/28/19 0545  WBC 10.9* 12.5*  NEUTROABS 9.6* 11.3*  HGB 13.8 15.0  HCT 43.4 47.9  MCV 95.0 94.9  PLT 329 334   Cardiac Enzymes: No results for input(s): CKTOTAL, CKMB, CKMBINDEX, TROPONINI in the last 168 hours. BNP (last 3 results) No results for input(s): PROBNP in the last 8760 hours. CBG: Recent Labs  Lab 05/27/19 0754 05/27/19 1154 05/27/19 1635 05/27/19 2220 05/28/19 0741  GLUCAP 324*  351* 277* 264* 229*   D-Dimer: Recent Labs    05/27/19 0307 05/28/19 0545  DDIMER 1.46* 2.29*   Hgb A1c: Recent Labs    05/26/19 1606 05/27/19 0307  HGBA1C 7.7* 7.7*   Lipid Profile: No results for input(s): CHOL, HDL, LDLCALC, TRIG, CHOLHDL, LDLDIRECT in the last 72 hours. Thyroid function studies: No results for input(s): TSH, T4TOTAL, T3FREE, THYROIDAB in the last 72 hours.  Invalid input(s): FREET3 Anemia work up: No results for input(s): VITAMINB12, FOLATE, FERRITIN, TIBC, IRON, RETICCTPCT in the last 72 hours. Sepsis Labs: Recent Labs  Lab 05/26/19 1030 05/27/19 0307 05/28/19 0545  PROCALCITON 0.44  --   --   WBC  --  10.9* 12.5*   Microbiology No results found for this or any previous visit (from the past 240 hour(s)).   Medications:   . albuterol  2  puff Inhalation Q6H  . allopurinol  300 mg Oral Daily  . aspirin EC  81 mg Oral Daily  . dexamethasone (DECADRON) injection  6 mg Intravenous Q12H  . enoxaparin (LOVENOX) injection  40 mg Subcutaneous Daily  . furosemide  40 mg Oral Daily  . hydrALAZINE  37.5 mg Oral TID  . insulin aspart  0-5 Units Subcutaneous QHS  . insulin aspart  0-9 Units Subcutaneous TID WC  . insulin detemir  10 Units Subcutaneous BID  . metoprolol succinate  25 mg Oral Daily  . pantoprazole  40 mg Oral Daily  . simvastatin  40 mg Oral Daily  . sodium chloride flush  3 mL Intravenous Q12H   Continuous Infusions: . remdesivir 100 mg in NS 100 mL 100 mg (05/27/19 0932)      LOS: 2 days   Charlynne Cousins  [facility]  05/28/2019, 8:29 AM

## 2019-05-28 NOTE — Plan of Care (Signed)
Patient's accu checks have remained elevated throughout shift. Service has been adjusting insulin dosing based on readings. Patient has been on 4LPM all shift and during 1600 VS patient was 81% on 4LPM. Increased to 6LPM and remaining between 89-90%. Paged and messaged service through chat. No new orders @ this time and will cnt to monitor patient's resp status. Encouraged prone positioning as tol and incentive spirometer use. Call light in reach.    Problem: Education: Goal: Knowledge of General Education information will improve Description: Including pain rating scale, medication(s)/side effects and non-pharmacologic comfort measures Outcome: Progressing   Problem: Health Behavior/Discharge Planning: Goal: Ability to manage health-related needs will improve Outcome: Progressing   Problem: Clinical Measurements: Goal: Ability to maintain clinical measurements within normal limits will improve Outcome: Progressing Goal: Will remain free from infection Outcome: Progressing Goal: Diagnostic test results will improve Outcome: Progressing Goal: Respiratory complications will improve Outcome: Progressing Goal: Cardiovascular complication will be avoided Outcome: Progressing   Problem: Activity: Goal: Risk for activity intolerance will decrease Outcome: Progressing   Problem: Nutrition: Goal: Adequate nutrition will be maintained Outcome: Progressing   Problem: Coping: Goal: Level of anxiety will decrease Outcome: Progressing   Problem: Elimination: Goal: Will not experience complications related to bowel motility Outcome: Progressing Goal: Will not experience complications related to urinary retention Outcome: Progressing   Problem: Pain Managment: Goal: General experience of comfort will improve Outcome: Progressing   Problem: Safety: Goal: Ability to remain free from injury will improve Outcome: Progressing   Problem: Skin Integrity: Goal: Risk for impaired skin  integrity will decrease Outcome: Progressing

## 2019-05-29 LAB — CBC WITH DIFFERENTIAL/PLATELET
Abs Immature Granulocytes: 0.21 10*3/uL — ABNORMAL HIGH (ref 0.00–0.07)
Basophils Absolute: 0 10*3/uL (ref 0.0–0.1)
Basophils Relative: 0 %
Eosinophils Absolute: 0 10*3/uL (ref 0.0–0.5)
Eosinophils Relative: 0 %
HCT: 49 % (ref 39.0–52.0)
Hemoglobin: 15.5 g/dL (ref 13.0–17.0)
Immature Granulocytes: 2 %
Lymphocytes Relative: 6 %
Lymphs Abs: 0.9 10*3/uL (ref 0.7–4.0)
MCH: 30.1 pg (ref 26.0–34.0)
MCHC: 31.6 g/dL (ref 30.0–36.0)
MCV: 95.1 fL (ref 80.0–100.0)
Monocytes Absolute: 0.4 10*3/uL (ref 0.1–1.0)
Monocytes Relative: 3 %
Neutro Abs: 12.3 10*3/uL — ABNORMAL HIGH (ref 1.7–7.7)
Neutrophils Relative %: 89 %
Platelets: 319 10*3/uL (ref 150–400)
RBC: 5.15 MIL/uL (ref 4.22–5.81)
RDW: 13.6 % (ref 11.5–15.5)
WBC: 13.9 10*3/uL — ABNORMAL HIGH (ref 4.0–10.5)
nRBC: 0.1 % (ref 0.0–0.2)

## 2019-05-29 LAB — COMPREHENSIVE METABOLIC PANEL
ALT: 51 U/L — ABNORMAL HIGH (ref 0–44)
AST: 65 U/L — ABNORMAL HIGH (ref 15–41)
Albumin: 2.9 g/dL — ABNORMAL LOW (ref 3.5–5.0)
Alkaline Phosphatase: 58 U/L (ref 38–126)
Anion gap: 13 (ref 5–15)
BUN: 43 mg/dL — ABNORMAL HIGH (ref 6–20)
CO2: 23 mmol/L (ref 22–32)
Calcium: 9.1 mg/dL (ref 8.9–10.3)
Chloride: 104 mmol/L (ref 98–111)
Creatinine, Ser: 1.35 mg/dL — ABNORMAL HIGH (ref 0.61–1.24)
GFR calc Af Amer: >60 mL/min (ref >60)
GFR calc non Af Amer: 57 mL/min — ABNORMAL LOW (ref >60)
Glucose, Bld: 190 mg/dL — ABNORMAL HIGH (ref 70–99)
Potassium: 4.6 mmol/L (ref 3.5–5.1)
Sodium: 140 mmol/L (ref 135–145)
Total Bilirubin: 0.6 mg/dL (ref 0.3–1.2)
Total Protein: 7.9 g/dL (ref 6.5–8.1)

## 2019-05-29 LAB — MAGNESIUM: Magnesium: 2.3 mg/dL (ref 1.7–2.4)

## 2019-05-29 LAB — BRAIN NATRIURETIC PEPTIDE: B Natriuretic Peptide: 251.8 pg/mL — ABNORMAL HIGH (ref 0.0–100.0)

## 2019-05-29 LAB — C-REACTIVE PROTEIN: CRP: 4.1 mg/dL — ABNORMAL HIGH (ref <1.0)

## 2019-05-29 LAB — D-DIMER, QUANTITATIVE: D-Dimer, Quant: 1.62 ug/mL-FEU — ABNORMAL HIGH (ref 0.00–0.50)

## 2019-05-29 MED ORDER — FUROSEMIDE 20 MG PO TABS
20.0000 mg | ORAL_TABLET | Freq: Every day | ORAL | Status: DC
  Administered 2019-05-29 – 2019-06-04 (×7): 20 mg via ORAL
  Filled 2019-05-29 (×7): qty 1

## 2019-05-29 MED ORDER — INSULIN ASPART 100 UNIT/ML ~~LOC~~ SOLN
10.0000 [IU] | Freq: Three times a day (TID) | SUBCUTANEOUS | Status: DC
  Administered 2019-05-29 – 2019-06-01 (×9): 10 [IU] via SUBCUTANEOUS

## 2019-05-29 MED ORDER — INSULIN DETEMIR 100 UNIT/ML ~~LOC~~ SOLN
75.0000 [IU] | Freq: Two times a day (BID) | SUBCUTANEOUS | Status: DC
  Administered 2019-05-29 (×2): 75 [IU] via SUBCUTANEOUS
  Filled 2019-05-29 (×3): qty 0.75

## 2019-05-29 MED ORDER — SACUBITRIL-VALSARTAN 24-26 MG PO TABS
1.0000 | ORAL_TABLET | Freq: Every day | ORAL | Status: DC
  Administered 2019-05-29 – 2019-06-03 (×6): 1 via ORAL
  Filled 2019-05-29 (×7): qty 1

## 2019-05-29 MED ORDER — METOPROLOL SUCCINATE ER 25 MG PO TB24
50.0000 mg | ORAL_TABLET | Freq: Every day | ORAL | Status: DC
  Administered 2019-05-29 – 2019-06-04 (×7): 50 mg via ORAL
  Filled 2019-05-29 (×7): qty 2

## 2019-05-29 NOTE — Plan of Care (Signed)
Patient increased to HFNC earlier this shift, but has since went back down to 5LPM with SPO2 >90's. Patient has had no complications or issues today. Call light in reach.    Problem: Education: Goal: Knowledge of General Education information will improve Description: Including pain rating scale, medication(s)/side effects and non-pharmacologic comfort measures Outcome: Progressing   Problem: Health Behavior/Discharge Planning: Goal: Ability to manage health-related needs will improve Outcome: Progressing   Problem: Clinical Measurements: Goal: Ability to maintain clinical measurements within normal limits will improve Outcome: Progressing Goal: Will remain free from infection Outcome: Progressing Goal: Diagnostic test results will improve Outcome: Progressing Goal: Respiratory complications will improve Outcome: Progressing Goal: Cardiovascular complication will be avoided Outcome: Progressing   Problem: Activity: Goal: Risk for activity intolerance will decrease Outcome: Progressing   Problem: Nutrition: Goal: Adequate nutrition will be maintained Outcome: Progressing   Problem: Coping: Goal: Level of anxiety will decrease Outcome: Progressing   Problem: Elimination: Goal: Will not experience complications related to bowel motility Outcome: Progressing Goal: Will not experience complications related to urinary retention Outcome: Progressing   Problem: Pain Managment: Goal: General experience of comfort will improve Outcome: Progressing   Problem: Safety: Goal: Ability to remain free from injury will improve Outcome: Progressing   Problem: Skin Integrity: Goal: Risk for impaired skin integrity will decrease Outcome: Progressing

## 2019-05-29 NOTE — Progress Notes (Signed)
Notified service d/t patient's SPO2 83% on 6LPM. Increased to 8LPM via salter and is @ 90%. Patient has not reported any SOB even with low SPO2 levels. Will monitor.

## 2019-05-29 NOTE — Progress Notes (Signed)
[facility] PROGRESS NOTE    Progress Note  Austin [name]  VZC:[id number] DOB: 02-Mar-1961 DOA: 05/26/2019 PCP: Paulina Fusi, MD     Brief Narrative:   Austin [name] is an 59 y.o. male past medical history of chronic systolic heart failure, AICD, essential hypertension lives at home, relates that he has not been feeling well for 4 days prior to admission went to [facility] ED was found to be hypoxic with improvement in his oxygenation with 15 L of high flow nasal cannula.  Chest x-ray showed bilateral infiltrate and SARS-CoV-2 PCR was positive.  He was started empirically on IV remdesivir and steroids  Assessment/Plan:   Acute respiratory failure with hypoxia secondary to pneumonia due to COVID-19; Now requiring 6 L to keep saturations greater than 96%. Inflammatory markers continue to improve, despite this he continues to require high amounts of oxygen. We will continue IV remdesivir and steroids, his last dose of IV remdesivir will be today continue steroids for 10 days. Try to keep the patient prone for early 16 hours a day if not prone out of bed to chair, continues incentive spirometry.  Chronic systolic heart failure: Unknown EF, AICD in place suspect less than 30%. Creatinine has remained stable now 1.5, his blood pressure is trending up we will resume beta-blocker and Entresto. Appears to be euvolemic on physical exam.  Chronic kidney disease stage IIIa: Creatinine appears to be at baseline, will continue current medications and monitor closely.  New hyperkalemia: He is currently on no supplementations his creatinine is remained stable.,  Decrease Lasix his potassium is improved this morning after 1 single dose of Kayexalate now 1.4 continue to monitor closely.  Uncontrolled diabetes mellitus type 2: With an A1c of 7.7, despite increasing long-acting insulin his blood glucose continues to be persistently above 300 increase long-acting insulin will increase meal  coverage.  Dyslipidemia Continue statin therapy.  GERD: Continue PPI.  Watery stool: Now resolved.   DVT prophylaxis: lovenox Family Communication: none Disposition Plan/Barrier to D/C: unable to determine Code Status:     Code Status Orders  (From admission, onward)         Start     Ordered   05/26/19 0956  Full code  Continuous     05/26/19 0957        Code Status History    This patient has a current code status but no historical code status.   Advance Care Planning Activity        IV Access:    Peripheral IV   Procedures and diagnostic studies:   No results found.   Medical Consultants:    None.  Anti-Infectives:  IV remdesivir  Subjective:    Austin [name] relates his breathing continues to improve.  Objective:    Vitals:   05/28/19 2204 05/29/19 0144 05/29/19 0356 05/29/19 0638  BP: 140/84  (!) 144/82   Pulse:   85   Resp:  20 20 20   Temp:   97.7 F (36.5 C)   TempSrc:   Oral   SpO2:  96% 95% 97%  Weight:      Height:       SpO2: 97 % O2 Flow Rate (L/min): 6 L/min FiO2 (%): (!) 3 %   Intake/Output Summary (Last 24 hours) at 05/29/2019 0755 Last data filed at 05/29/2019 0358 Gross per 24 hour  Intake 840 ml  Output 1125 ml  Net -285 ml   Filed Weights   05/26/19 1020  Weight:  86.2 kg    Exam: General exam: In no acute distress. Respiratory system: Good air movement and diffuse crackles bilaterally. Cardiovascular system: S1 & S2 heard, RRR. No JVD. Gastrointestinal system: Abdomen is nondistended, soft and nontender.  Central nervous system: Alert and oriented. No focal neurological deficits. Extremities: No pedal edema. Skin: No rashes, lesions or ulcers Psychiatry: Judgement and insight appear normal. Mood & affect appropriate.    Data Reviewed:    Labs: Basic Metabolic Panel: Recent Labs  Lab 05/26/19 1030 05/27/19 0307 05/28/19 0545 05/28/19 1705  NA 138 134* 135 138  K 4.6 4.6 5.2* 4.4  CL  102 100 98 107  CO2 24 24 22 22   GLUCOSE 308* 258* 256* 289*  BUN 41* 44* 49* 46*  CREATININE 1.38* 1.29* 1.36* 1.58*  CALCIUM 8.9 8.6* 8.9 8.9  MG  --  2.9* 2.6*  --    GFR Estimated Creatinine Clearance: 53.6 mL/min (A) (by C-G formula based on SCr of 1.58 mg/dL (H)). Liver Function Tests: Recent Labs  Lab 05/26/19 1030 05/27/19 0307 05/28/19 0545  AST 33 33 47*  ALT 34 31 39  ALKPHOS 52 51 52  BILITOT 0.8 1.0 1.1  PROT 8.5* 8.3* 7.9  ALBUMIN 2.9* 2.8* 2.9*   No results for input(s): LIPASE, AMYLASE in the last 168 hours. No results for input(s): AMMONIA in the last 168 hours. Coagulation profile No results for input(s): INR, PROTIME in the last 168 hours. COVID-19 Labs  Recent Labs    05/26/19 1030 05/27/19 0307 05/28/19 0545  DDIMER 2.29* 1.46* 2.29*  CRP 25.6* 19.8* 9.1*    No results found for: SARSCOV2NAA  CBC: Recent Labs  Lab 05/27/19 0307 05/28/19 0545  WBC 10.9* 12.5*  NEUTROABS 9.6* 11.3*  HGB 13.8 15.0  HCT 43.4 47.9  MCV 95.0 94.9  PLT 329 334   Cardiac Enzymes: No results for input(s): CKTOTAL, CKMB, CKMBINDEX, TROPONINI in the last 168 hours. BNP (last 3 results) No results for input(s): PROBNP in the last 8760 hours. CBG: Recent Labs  Lab 05/27/19 1635 05/27/19 2220 05/28/19 0741 05/28/19 1133 05/28/19 1545  GLUCAP 277* 264* 229* 352* 358*   D-Dimer: Recent Labs    05/27/19 0307 05/28/19 0545  DDIMER 1.46* 2.29*   Hgb A1c: Recent Labs    05/26/19 1606 05/27/19 0307  HGBA1C 7.7* 7.7*   Lipid Profile: No results for input(s): CHOL, HDL, LDLCALC, TRIG, CHOLHDL, LDLDIRECT in the last 72 hours. Thyroid function studies: No results for input(s): TSH, T4TOTAL, T3FREE, THYROIDAB in the last 72 hours.  Invalid input(s): FREET3 Anemia work up: No results for input(s): VITAMINB12, FOLATE, FERRITIN, TIBC, IRON, RETICCTPCT in the last 72 hours. Sepsis Labs: Recent Labs  Lab 05/26/19 1030 05/27/19 0307 05/28/19 0545   PROCALCITON 0.44  --   --   WBC  --  10.9* 12.5*   Microbiology No results found for this or any previous visit (from the past 240 hour(s)).   Medications:   . albuterol  2 puff Inhalation Q6H  . allopurinol  300 mg Oral Daily  . aspirin EC  81 mg Oral Daily  . dexamethasone (DECADRON) injection  6 mg Intravenous Q12H  . enoxaparin (LOVENOX) injection  40 mg Subcutaneous Daily  . furosemide  40 mg Oral Daily  . hydrALAZINE  37.5 mg Oral TID  . insulin aspart  0-20 Units Subcutaneous TID WC  . insulin aspart  0-5 Units Subcutaneous QHS  . insulin aspart  6 Units Subcutaneous TID WC  .  insulin detemir  50 Units Subcutaneous BID  . metoprolol succinate  25 mg Oral Daily  . pantoprazole  40 mg Oral Daily  . simvastatin  40 mg Oral Daily  . sodium chloride flush  3 mL Intravenous Q12H   Continuous Infusions: . remdesivir 100 mg in NS 100 mL Stopped (05/28/19 0947)      LOS: 3 days   Charlynne Cousins  [facility]  05/29/2019, 7:55 AM

## 2019-05-30 ENCOUNTER — Inpatient Hospital Stay (HOSPITAL_COMMUNITY): Payer: Medicare Other

## 2019-05-30 LAB — COMPREHENSIVE METABOLIC PANEL
ALT: 61 U/L — ABNORMAL HIGH (ref 0–44)
AST: 73 U/L — ABNORMAL HIGH (ref 15–41)
Albumin: 3.1 g/dL — ABNORMAL LOW (ref 3.5–5.0)
Alkaline Phosphatase: 60 U/L (ref 38–126)
Anion gap: 14 (ref 5–15)
BUN: 41 mg/dL — ABNORMAL HIGH (ref 6–20)
CO2: 25 mmol/L (ref 22–32)
Calcium: 9.2 mg/dL (ref 8.9–10.3)
Chloride: 100 mmol/L (ref 98–111)
Creatinine, Ser: 1.32 mg/dL — ABNORMAL HIGH (ref 0.61–1.24)
GFR calc Af Amer: >60 mL/min (ref >60)
GFR calc non Af Amer: 59 mL/min — ABNORMAL LOW (ref >60)
Glucose, Bld: 46 mg/dL — ABNORMAL LOW (ref 70–99)
Potassium: 4.2 mmol/L (ref 3.5–5.1)
Sodium: 139 mmol/L (ref 135–145)
Total Bilirubin: 0.6 mg/dL (ref 0.3–1.2)
Total Protein: 8 g/dL (ref 6.5–8.1)

## 2019-05-30 LAB — CBC WITH DIFFERENTIAL/PLATELET
Abs Immature Granulocytes: 0.57 10*3/uL — ABNORMAL HIGH (ref 0.00–0.07)
Basophils Absolute: 0.1 10*3/uL (ref 0.0–0.1)
Basophils Relative: 0 %
Eosinophils Absolute: 0 10*3/uL (ref 0.0–0.5)
Eosinophils Relative: 0 %
HCT: 51.8 % (ref 39.0–52.0)
Hemoglobin: 16.3 g/dL (ref 13.0–17.0)
Immature Granulocytes: 3 %
Lymphocytes Relative: 8 %
Lymphs Abs: 1.7 10*3/uL (ref 0.7–4.0)
MCH: 30.3 pg (ref 26.0–34.0)
MCHC: 31.5 g/dL (ref 30.0–36.0)
MCV: 96.3 fL (ref 80.0–100.0)
Monocytes Absolute: 0.5 10*3/uL (ref 0.1–1.0)
Monocytes Relative: 2 %
Neutro Abs: 18 10*3/uL — ABNORMAL HIGH (ref 1.7–7.7)
Neutrophils Relative %: 87 %
Platelets: 371 10*3/uL (ref 150–400)
RBC: 5.38 MIL/uL (ref 4.22–5.81)
RDW: 13.8 % (ref 11.5–15.5)
WBC: 20.8 10*3/uL — ABNORMAL HIGH (ref 4.0–10.5)
nRBC: 0 % (ref 0.0–0.2)

## 2019-05-30 LAB — GLUCOSE, CAPILLARY
Glucose-Capillary: 85 mg/dL (ref 70–99)
Glucose-Capillary: 147 mg/dL — ABNORMAL HIGH (ref 70–99)
Glucose-Capillary: 85 mg/dL (ref 70–99)
Glucose-Capillary: 126 mg/dL — ABNORMAL HIGH (ref 70–99)

## 2019-05-30 LAB — D-DIMER, QUANTITATIVE: D-Dimer, Quant: 1.51 ug/mL-FEU — ABNORMAL HIGH (ref 0.00–0.50)

## 2019-05-30 LAB — MAGNESIUM: Magnesium: 2.6 mg/dL — ABNORMAL HIGH (ref 1.7–2.4)

## 2019-05-30 LAB — PROCALCITONIN: Procalcitonin: 0.17 ng/mL

## 2019-05-30 LAB — BRAIN NATRIURETIC PEPTIDE: B Natriuretic Peptide: 139.3 pg/mL — ABNORMAL HIGH (ref 0.0–100.0)

## 2019-05-30 LAB — C-REACTIVE PROTEIN: CRP: 3.4 mg/dL — ABNORMAL HIGH (ref <1.0)

## 2019-05-30 MED ORDER — INSULIN DETEMIR 100 UNIT/ML ~~LOC~~ SOLN
65.0000 [IU] | Freq: Two times a day (BID) | SUBCUTANEOUS | Status: DC
  Administered 2019-05-30 – 2019-05-31 (×4): 65 [IU] via SUBCUTANEOUS
  Filled 2019-05-30 (×5): qty 0.65

## 2019-05-30 MED ORDER — DEXAMETHASONE 6 MG PO TABS
6.0000 mg | ORAL_TABLET | Freq: Once | ORAL | Status: DC
  Administered 2019-05-30: 6 mg via ORAL

## 2019-05-30 MED ORDER — DEXAMETHASONE 6 MG PO TABS
6.0000 mg | ORAL_TABLET | Freq: Every day | ORAL | Status: DC
  Administered 2019-05-31 – 2019-06-04 (×5): 6 mg via ORAL
  Filled 2019-05-30 (×6): qty 1

## 2019-05-30 MED ORDER — SODIUM CHLORIDE 0.9% FLUSH: 10.0000 mL | INTRAVENOUS | Status: DC | PRN

## 2019-05-30 MED ORDER — SODIUM CHLORIDE 0.9% FLUSH
10.0000 mL | Freq: Two times a day (BID) | INTRAVENOUS | Status: DC
  Administered 2019-05-30 – 2019-06-03 (×8): 10 mL

## 2019-05-30 NOTE — Progress Notes (Signed)
[facility] PROGRESS NOTE    Progress Note  Austin [name]  [id number]:[id number] DOB: June 01, 1960 DOA: 05/26/2019 PCP: Paulina Fusi, MD     Brief Narrative:   Austin [name] is an 59 y.o. male past medical history of chronic systolic heart failure, AICD, essential hypertension lives at home, relates that he has not been feeling well for 4 days prior to admission went to [facility] ED was found to be hypoxic with improvement in his oxygenation with 15 L of high flow nasal cannula.  Chest x-ray showed bilateral infiltrate and SARS-CoV-2 PCR was positive.  He was started empirically on IV remdesivir and steroids  Assessment/Plan:   Acute respiratory failure with hypoxia secondary to pneumonia due to COVID-19; He is now requiring 6 to 10 L of oxygen to keep saturations greater than 92%. Oxygen requirements continue to worsen and despite inflammatory markers continue to improve. He has finished his course of IV remdesivir will continue IV steroids for at least 10 days. Try to keep the patient prone for early 16 hours a day, if not prone out of bed to chair, continues into spirometry. He is requiring more oxygen and has a new leukocytosis we will get a chest x-ray, procalcitonin and blood cultures.  Chronic systolic heart failure: Unknown EF with an AICD in place suspect his EF is less than 30%. His creatinine seems to be at baseline, blood pressure is well controlled on beta-blocker and Entresto. He appears to be euvolemic on physical exam.  Chronic kidney disease stage IIIa: Peers to be at baseline continue current medication.  New hyperkalemia: Currently on oral supplementations we will continue to monitor. It has resolved with Kayexalate.  Uncontrolled diabetes mellitus type 2: Extremely hard to control blood glucose currently on long-acting insulin sliding scale. Had an episode of hypoglycemia this morning we will decrease long-acting insulin he also had a significant decrease  in his oral intake yesterday night he only about 25% of his meal.  Continue current regimen for sliding scale.  Dyslipidemia Continue statin therapy.  GERD: Continue PPI.  Watery stool: Now resolved.   DVT prophylaxis: lovenox Family Communication: none Disposition Plan/Barrier to D/C: unable to determine Code Status:     Code Status Orders  (From admission, onward)         Start     Ordered   05/26/19 0956  Full code  Continuous     05/26/19 0957        Code Status History    This patient has a current code status but no historical code status.   Advance Care Planning Activity        IV Access:    Peripheral IV   Procedures and diagnostic studies:   No results found.   Medical Consultants:    None.  Anti-Infectives:  IV remdesivir  Subjective:    [name] he relates his breathing is about the same no better or worse.  Objective:    Vitals:   05/30/19 0432 05/30/19 0438 05/30/19 0445 05/30/19 0455  BP: 111/83     Pulse: 84     Resp: 18  18 18   Temp: 98 F (36.7 C)     TempSrc: Oral     SpO2: (!) 81% 90% 95% 93%  Weight:      Height:       SpO2: 93 % O2 Flow Rate (L/min): 10 L/min FiO2 (%): (!) 3 %   Intake/Output Summary (Last 24 hours) at 05/30/2019 0810 Last  data filed at 05/30/2019 0439 Gross per 24 hour  Intake 480 ml  Output 950 ml  Net -470 ml   Filed Weights   05/26/19 1020  Weight: 86.2 kg    Exam: General exam: In no acute distress. Respiratory system: Good air movement and diffuse crackles bilaterally. Cardiovascular system: S1 & S2 heard, RRR. No JVD. Gastrointestinal system: Abdomen is nondistended, soft and nontender.  Central nervous system: Alert and oriented. No focal neurological deficits. Extremities: No pedal edema. Skin: No rashes, lesions or ulcers Psychiatry: Judgement and insight appear normal. Mood & affect appropriate.    Data Reviewed:    Labs: Basic Metabolic Panel: Recent Labs    Lab 05/27/19 0307 05/28/19 0545 05/28/19 1705 05/29/19 0950 05/30/19 0410  NA 134* 135 138 140 139  K 4.6 5.2* 4.4 4.6 4.2  CL 100 98 107 104 100  CO2 24 22 22 23 25   GLUCOSE 258* 256* 289* 190* 46*  BUN 44* 49* 46* 43* 41*  CREATININE 1.29* 1.36* 1.58* 1.35* 1.32*  CALCIUM 8.6* 8.9 8.9 9.1 9.2  MG 2.9* 2.6*  --  2.3 2.6*   GFR Estimated Creatinine Clearance: 64.2 mL/min (A) (by C-G formula based on SCr of 1.32 mg/dL (H)). Liver Function Tests: Recent Labs  Lab 05/26/19 1030 05/27/19 0307 05/28/19 0545 05/29/19 0950 05/30/19 0410  AST 33 33 47* 65* 73*  ALT 34 31 39 51* 61*  ALKPHOS 52 51 52 58 60  BILITOT 0.8 1.0 1.1 0.6 0.6  PROT 8.5* 8.3* 7.9 7.9 8.0  ALBUMIN 2.9* 2.8* 2.9* 2.9* 3.1*   No results for input(s): LIPASE, AMYLASE in the last 168 hours. No results for input(s): AMMONIA in the last 168 hours. Coagulation profile No results for input(s): INR, PROTIME in the last 168 hours. COVID-19 Labs  Recent Labs    05/28/19 0545 05/29/19 0950 05/30/19 0410  DDIMER 2.29* 1.62* 1.51*  CRP 9.1* 4.1* 3.4*    No results found for: SARSCOV2NAA  CBC: Recent Labs  Lab 05/27/19 0307 05/28/19 0545 05/29/19 0950 05/30/19 0410  WBC 10.9* 12.5* 13.9* 20.8*  NEUTROABS 9.6* 11.3* 12.3* 18.0*  HGB 13.8 15.0 15.5 16.3  HCT 43.4 47.9 49.0 51.8  MCV 95.0 94.9 95.1 96.3  PLT 329 334 319 371   Cardiac Enzymes: No results for input(s): CKTOTAL, CKMB, CKMBINDEX, TROPONINI in the last 168 hours. BNP (last 3 results) No results for input(s): PROBNP in the last 8760 hours. CBG: Recent Labs  Lab 05/27/19 2220 05/28/19 0741 05/28/19 1133 05/28/19 1545 05/30/19 0748  GLUCAP 264* 229* 352* 358* 85   D-Dimer: Recent Labs    05/29/19 0950 05/30/19 0410  DDIMER 1.62* 1.51*   Hgb A1c: No results for input(s): HGBA1C in the last 72 hours. Lipid Profile: No results for input(s): CHOL, HDL, LDLCALC, TRIG, CHOLHDL, LDLDIRECT in the last 72 hours. Thyroid function  studies: No results for input(s): TSH, T4TOTAL, T3FREE, THYROIDAB in the last 72 hours.  Invalid input(s): FREET3 Anemia work up: No results for input(s): VITAMINB12, FOLATE, FERRITIN, TIBC, IRON, RETICCTPCT in the last 72 hours. Sepsis Labs: Recent Labs  Lab 05/26/19 1030 05/27/19 0307 05/28/19 0545 05/29/19 0950 05/30/19 0410  PROCALCITON 0.44  --   --   --   --   WBC  --  10.9* 12.5* 13.9* 20.8*   Microbiology No results found for this or any previous visit (from the past 240 hour(s)).   Medications:   . albuterol  2 puff Inhalation Q6H  . allopurinol  300 mg Oral Daily  . aspirin EC  81 mg Oral Daily  . dexamethasone (DECADRON) injection  6 mg Intravenous Q12H  . enoxaparin (LOVENOX) injection  40 mg Subcutaneous Daily  . furosemide  20 mg Oral Daily  . hydrALAZINE  37.5 mg Oral TID  . insulin aspart  0-20 Units Subcutaneous TID WC  . insulin aspart  0-5 Units Subcutaneous QHS  . insulin aspart  10 Units Subcutaneous TID WC  . insulin detemir  75 Units Subcutaneous BID  . metoprolol succinate  50 mg Oral Daily  . pantoprazole  40 mg Oral Daily  . sacubitril-valsartan  1 tablet Oral Daily  . simvastatin  40 mg Oral Daily  . sodium chloride flush  3 mL Intravenous Q12H   Continuous Infusions:     LOS: 4 days   Marinda Elk  [facility]  05/30/2019, 8:10 AM

## 2019-05-30 NOTE — Plan of Care (Signed)

## 2019-05-30 NOTE — Progress Notes (Deleted)
Paged MD d/t BP and HR right @ parameter and patient has been dropping to 90's SBP after am dose in the pm the last few days. MD said he will d/c this for now and see how patient does without it and to hold this am dose 

## 2019-05-30 NOTE — Progress Notes (Signed)
At 4 am vitals patient SPO2 was 81 percent at [facility]. Increased to 15LHFNC.  Could not get sat to read above 90%. Changed probe from finger to ear and sat was reading 97%.  Reduced to 10LHFNC, will recheck.

## 2019-05-30 NOTE — Plan of Care (Signed)
Patient remains stable on 8 LPM via HFNC. Patient has had a good appetite and no issues with dyspnea. Call light in reach.   Problem: Education: Goal: Knowledge of General Education information will improve Description: Including pain rating scale, medication(s)/side effects and non-pharmacologic comfort measures Outcome: Progressing   Problem: Health Behavior/Discharge Planning: Goal: Ability to manage health-related needs will improve Outcome: Progressing   Problem: Clinical Measurements: Goal: Ability to maintain clinical measurements within normal limits will improve Outcome: Progressing Goal: Will remain free from infection Outcome: Progressing Goal: Diagnostic test results will improve Outcome: Progressing Goal: Respiratory complications will improve Outcome: Progressing Goal: Cardiovascular complication will be avoided Outcome: Progressing   Problem: Activity: Goal: Risk for activity intolerance will decrease Outcome: Progressing   Problem: Nutrition: Goal: Adequate nutrition will be maintained Outcome: Progressing   Problem: Coping: Goal: Level of anxiety will decrease Outcome: Progressing   Problem: Elimination: Goal: Will not experience complications related to bowel motility Outcome: Progressing Goal: Will not experience complications related to urinary retention Outcome: Progressing   Problem: Pain Managment: Goal: General experience of comfort will improve Outcome: Progressing   Problem: Safety: Goal: Ability to remain free from injury will improve Outcome: Progressing   Problem: Skin Integrity: Goal: Risk for impaired skin integrity will decrease Outcome: Progressing

## 2019-05-30 NOTE — Progress Notes (Signed)
Spoke with wife per patient request and updated on patient's status. Questions answered. No further questions or concerns @ this time.

## 2019-05-31 LAB — CBC WITH DIFFERENTIAL/PLATELET
Abs Immature Granulocytes: 0.39 10*3/uL — ABNORMAL HIGH (ref 0.00–0.07)
Basophils Absolute: 0.1 10*3/uL (ref 0.0–0.1)
Basophils Relative: 0 %
Eosinophils Absolute: 0 10*3/uL (ref 0.0–0.5)
Eosinophils Relative: 0 %
HCT: 51.2 % (ref 39.0–52.0)
Hemoglobin: 16.1 g/dL (ref 13.0–17.0)
Immature Granulocytes: 2 %
Lymphocytes Relative: 7 %
Lymphs Abs: 1.4 10*3/uL (ref 0.7–4.0)
MCH: 30.6 pg (ref 26.0–34.0)
MCHC: 31.4 g/dL (ref 30.0–36.0)
MCV: 97.2 fL (ref 80.0–100.0)
Monocytes Absolute: 0.5 10*3/uL (ref 0.1–1.0)
Monocytes Relative: 3 %
Neutro Abs: 17 10*3/uL — ABNORMAL HIGH (ref 1.7–7.7)
Neutrophils Relative %: 88 %
Platelets: 329 10*3/uL (ref 150–400)
RBC: 5.27 MIL/uL (ref 4.22–5.81)
RDW: 13.9 % (ref 11.5–15.5)
WBC: 19.4 10*3/uL — ABNORMAL HIGH (ref 4.0–10.5)
nRBC: 0 % (ref 0.0–0.2)

## 2019-05-31 LAB — PROCALCITONIN: Procalcitonin: 0.16 ng/mL

## 2019-05-31 LAB — GLUCOSE, CAPILLARY
Glucose-Capillary: 149 mg/dL — ABNORMAL HIGH (ref 70–99)
Glucose-Capillary: 92 mg/dL (ref 70–99)

## 2019-05-31 MED ORDER — POLYETHYLENE GLYCOL 3350 17 G PO PACK
17.0000 g | PACK | Freq: Two times a day (BID) | ORAL | Status: AC
  Administered 2019-05-31 (×2): 17 g via ORAL
  Filled 2019-05-31 (×2): qty 1

## 2019-05-31 NOTE — Plan of Care (Signed)
Patient has had no changes this shift. Titrated down to 3LPM of O2 via [place] currently and tolerating well since this am. Patient is self proning and getting up to chair regularly. Call light in reach. Spoke with wife to update on patient's status and POC and all questions answered.    Problem: Education: Goal: Knowledge of General Education information will improve Description: Including pain rating scale, medication(s)/side effects and non-pharmacologic comfort measures Outcome: Progressing   Problem: Health Behavior/Discharge Planning: Goal: Ability to manage health-related needs will improve Outcome: Progressing   Problem: Clinical Measurements: Goal: Ability to maintain clinical measurements within normal limits will improve Outcome: Progressing Goal: Will remain free from infection Outcome: Progressing Goal: Diagnostic test results will improve Outcome: Progressing Goal: Respiratory complications will improve Outcome: Progressing Goal: Cardiovascular complication will be avoided Outcome: Progressing   Problem: Activity: Goal: Risk for activity intolerance will decrease Outcome: Progressing   Problem: Nutrition: Goal: Adequate nutrition will be maintained Outcome: Progressing   Problem: Coping: Goal: Level of anxiety will decrease Outcome: Progressing   Problem: Elimination: Goal: Will not experience complications related to bowel motility Outcome: Progressing Goal: Will not experience complications related to urinary retention Outcome: Progressing   Problem: Pain Managment: Goal: General experience of comfort will improve Outcome: Progressing   Problem: Safety: Goal: Ability to remain free from injury will improve Outcome: Progressing   Problem: Skin Integrity: Goal: Risk for impaired skin integrity will decrease Outcome: Progressing

## 2019-05-31 NOTE — Plan of Care (Signed)

## 2019-05-31 NOTE — Progress Notes (Signed)
[facility] PROGRESS NOTE    Progress Note  Austin [name]  TGG:[id number] DOB: [birth year] DOA: 05/26/2019 PCP: Nicoletta Dress, MD     Brief Narrative:   Austin [name] is an 59 y.o. male past medical history of chronic systolic heart failure, AICD, essential hypertension lives at home, relates that he has not been feeling well for 4 days prior to admission went to [facility] ED was found to be hypoxic with improvement in his oxygenation with 15 L of high flow nasal cannula.  Chest x-ray showed bilateral infiltrate and SARS-CoV-2 PCR was positive.  He was started empirically on IV remdesivir and steroids  Assessment/Plan:   Acute respiratory failure with hypoxia secondary to pneumonia due to COVID-19; He is now requiring 3 L of oxygen to keep saturations greater than 96%. His oxygen requirements are improving, as well as his inflammatory markers. He has completed a course of IV remdesivir, will continue steroids for a total of no more than 10 days. Try to keep the patient prone for at least 16 hours a day, not prone out of bed to chair, continuous into spirometry and flutter valve. Procalcitonin is 0.1, white blood cell count is persistently elevated at 19, he has remained afebrile, his procalcitonin is 0.16 Chest x-ray show improving aeriation. We will continue current regimen.  He relates his breathing is significantly better.  Chronic systolic heart failure: Unknown EF, with an AICD in place I suspect his EF is less than 30%. His creatinine seems to be at baseline, blood pressure is well controlled.  Continue beta-blockers and Entresto.  Chronic kidney disease stage IIIa: Peers to be at baseline continue current medication.  New hyperkalemia: Currently on oral supplementations we will continue to monitor. It has resolved with Kayexalate.  Uncontrolled diabetes mellitus type 2: His blood glucose was extremely hard to control, with a current regimen of 65 units twice a  day plus resistant sliding scale his glycemic control has significantly improved we will continue current regimen.  Dyslipidemia Continue statin therapy.  GERD: Continue PPI.  Watery stool: Now resolved.   DVT prophylaxis: lovenox Family Communication: none Disposition Plan/Barrier to D/C: Hopefully home in 1 to 2 days Code Status:     Code Status Orders  (From admission, onward)         Start     Ordered   05/26/19 0956  Full code  Continuous     05/26/19 0957        Code Status History    This patient has a current code status but no historical code status.   Advance Care Planning Activity        IV Access:    Peripheral IV   Procedures and diagnostic studies:   DG CHEST PORT 1 VIEW  Result Date: 05/30/2019 CLINICAL DATA:  Leukocytosis EXAM: PORTABLE CHEST 1 VIEW COMPARISON:  05/26/2019 FINDINGS: Dual lead pacer/AICD device. Midline trachea. Moderate cardiomegaly. No pleural effusion or pneumothorax. Lower lung predominant interstitial opacities are slightly improved. Slightly worse on the right than left. IMPRESSION: Slightly improved aeration with persistent basilar predominant interstitial opacities. Pulmonary edema versus viral pneumonia. Electronically Signed   By: Abigail Miyamoto M.D.   On: 05/30/2019 15:08     Medical Consultants:    None.  Anti-Infectives:  IV remdesivir  Subjective:    Austin [name] relates he can tell a difference on his breathing today, he relates he is slightly better than yesterday  Objective:    Vitals:   05/30/19  1600 05/30/19 2100 05/31/19 0404 05/31/19 0800  BP: 124/87 114/80 (!) 126/96 102/82  Pulse: 77 88 84 84  Resp: 16 16 18 16   Temp: 98.8 F (37.1 C) 98.6 F (37 C) 97.8 F (36.6 C) 98.3 F (36.8 C)  TempSrc: Oral Oral Oral Oral  SpO2: 93% 97% 100% 96%  Weight:      Height:       SpO2: 96 % O2 Flow Rate (L/min): 3 L/min FiO2 (%): (!) 3 %   Intake/Output Summary (Last 24 hours) at 05/31/2019  0811 Last data filed at 05/31/2019 0600 Gross per 24 hour  Intake 240 ml  Output 500 ml  Net -260 ml   Filed Weights   05/26/19 1020  Weight: 86.2 kg    Exam: General exam: In no acute distress. Respiratory system: Good air movement and diffuse crackles bilaterally. Cardiovascular system: S1 & S2 heard, RRR. No JVD. Gastrointestinal system: Abdomen is nondistended, soft and nontender.  Central nervous system: Alert and oriented. No focal neurological deficits. Extremities: No pedal edema. Skin: No rashes, lesions or ulcers Psychiatry: Judgement and insight appear normal. Mood & affect appropriate.   Data Reviewed:    Labs: Basic Metabolic Panel: Recent Labs  Lab 05/27/19 0307 05/28/19 0545 05/28/19 1705 05/29/19 0950 05/30/19 0410  NA 134* 135 138 140 139  K 4.6 5.2* 4.4 4.6 4.2  CL 100 98 107 104 100  CO2 24 22 22 23 25   GLUCOSE 258* 256* 289* 190* 46*  BUN 44* 49* 46* 43* 41*  CREATININE 1.29* 1.36* 1.58* 1.35* 1.32*  CALCIUM 8.6* 8.9 8.9 9.1 9.2  MG 2.9* 2.6*  --  2.3 2.6*   GFR Estimated Creatinine Clearance: 64.2 mL/min (A) (by C-G formula based on SCr of 1.32 mg/dL (H)). Liver Function Tests: Recent Labs  Lab 05/26/19 1030 05/27/19 0307 05/28/19 0545 05/29/19 0950 05/30/19 0410  AST 33 33 47* 65* 73*  ALT 34 31 39 51* 61*  ALKPHOS 52 51 52 58 60  BILITOT 0.8 1.0 1.1 0.6 0.6  PROT 8.5* 8.3* 7.9 7.9 8.0  ALBUMIN 2.9* 2.8* 2.9* 2.9* 3.1*   No results for input(s): LIPASE, AMYLASE in the last 168 hours. No results for input(s): AMMONIA in the last 168 hours. Coagulation profile No results for input(s): INR, PROTIME in the last 168 hours. COVID-19 Labs  Recent Labs    05/29/19 0950 05/30/19 0410  DDIMER 1.62* 1.51*  CRP 4.1* 3.4*    No results found for: SARSCOV2NAA  CBC: Recent Labs  Lab 05/27/19 0307 05/28/19 0545 05/29/19 0950 05/30/19 0410  WBC 10.9* 12.5* 13.9* 20.8*  NEUTROABS 9.6* 11.3* 12.3* 18.0*  HGB 13.8 15.0 15.5 16.3   HCT 43.4 47.9 49.0 51.8  MCV 95.0 94.9 95.1 96.3  PLT 329 334 319 371   Cardiac Enzymes: No results for input(s): CKTOTAL, CKMB, CKMBINDEX, TROPONINI in the last 168 hours. BNP (last 3 results) No results for input(s): PROBNP in the last 8760 hours. CBG: Recent Labs  Lab 05/28/19 1545 05/30/19 0748 05/30/19 1209 05/30/19 1605 05/30/19 2121  GLUCAP 358* 85 147* 85 126*   D-Dimer: Recent Labs    05/29/19 0950 05/30/19 0410  DDIMER 1.62* 1.51*   Hgb A1c: No results for input(s): HGBA1C in the last 72 hours. Lipid Profile: No results for input(s): CHOL, HDL, LDLCALC, TRIG, CHOLHDL, LDLDIRECT in the last 72 hours. Thyroid function studies: No results for input(s): TSH, T4TOTAL, T3FREE, THYROIDAB in the last 72 hours.  Invalid input(s): FREET3 Anemia  work up: No results for input(s): VITAMINB12, FOLATE, FERRITIN, TIBC, IRON, RETICCTPCT in the last 72 hours. Sepsis Labs: Recent Labs  Lab 05/26/19 1030 05/27/19 0307 05/28/19 0545 05/29/19 0950 05/30/19 0410 05/31/19 0500  PROCALCITON 0.44  --   --   --  0.17 0.16  WBC  --  10.9* 12.5* 13.9* 20.8*  --    Microbiology No results found for this or any previous visit (from the past 240 hour(s)).   Medications:   . albuterol  2 puff Inhalation Q6H  . allopurinol  300 mg Oral Daily  . aspirin EC  81 mg Oral Daily  . dexamethasone  6 mg Oral Daily  . enoxaparin (LOVENOX) injection  40 mg Subcutaneous Daily  . furosemide  20 mg Oral Daily  . hydrALAZINE  37.5 mg Oral TID  . insulin aspart  0-20 Units Subcutaneous TID WC  . insulin aspart  0-5 Units Subcutaneous QHS  . insulin aspart  10 Units Subcutaneous TID WC  . insulin detemir  65 Units Subcutaneous BID  . metoprolol succinate  50 mg Oral Daily  . pantoprazole  40 mg Oral Daily  . sacubitril-valsartan  1 tablet Oral Daily  . simvastatin  40 mg Oral Daily  . sodium chloride flush  10-40 mL Intracatheter Q12H  . sodium chloride flush  3 mL Intravenous Q12H    Continuous Infusions:     LOS: 5 days   Marinda Elk  [facility]  05/31/2019, 8:11 AM

## 2019-06-01 LAB — GLUCOSE, CAPILLARY
Glucose-Capillary: 100 mg/dL — ABNORMAL HIGH (ref 70–99)
Glucose-Capillary: 154 mg/dL — ABNORMAL HIGH (ref 70–99)
Glucose-Capillary: 174 mg/dL — ABNORMAL HIGH (ref 70–99)
Glucose-Capillary: 162 mg/dL — ABNORMAL HIGH (ref 70–99)
Glucose-Capillary: 110 mg/dL — ABNORMAL HIGH (ref 70–99)
Glucose-Capillary: 72 mg/dL (ref 70–99)
Glucose-Capillary: 81 mg/dL (ref 70–99)
Glucose-Capillary: 58 mg/dL — ABNORMAL LOW (ref 70–99)
Glucose-Capillary: 205 mg/dL — ABNORMAL HIGH (ref 70–99)
Glucose-Capillary: 277 mg/dL — ABNORMAL HIGH (ref 70–99)
Glucose-Capillary: 45 mg/dL — ABNORMAL LOW (ref 70–99)
Glucose-Capillary: 156 mg/dL — ABNORMAL HIGH (ref 70–99)
Glucose-Capillary: 178 mg/dL — ABNORMAL HIGH (ref 70–99)

## 2019-06-01 LAB — PROCALCITONIN: Procalcitonin: 0.17 ng/mL

## 2019-06-01 MED ORDER — INSULIN DETEMIR 100 UNIT/ML ~~LOC~~ SOLN
40.0000 [IU] | Freq: Two times a day (BID) | SUBCUTANEOUS | Status: DC
  Administered 2019-06-01 (×2): 40 [IU] via SUBCUTANEOUS
  Filled 2019-06-01 (×3): qty 0.4

## 2019-06-01 MED ORDER — DEXTROSE 50 % IV SOLN
25.0000 mL | Freq: Once | INTRAVENOUS | Status: DC
  Filled 2019-06-01: qty 50

## 2019-06-01 MED ORDER — POLYETHYLENE GLYCOL 3350 17 G PO PACK
17.0000 g | PACK | Freq: Two times a day (BID) | ORAL | Status: AC
  Administered 2019-06-01: 17 g via ORAL
  Filled 2019-06-01 (×2): qty 1

## 2019-06-01 MED ORDER — DEXTROSE 50 % IV SOLN
INTRAVENOUS | Status: AC
  Administered 2019-06-01: 50 mL
  Filled 2019-06-01: qty 50

## 2019-06-01 NOTE — Plan of Care (Addendum)
Patient CBG read 47, then 58 (immediate recheck).  Totally asymptomatic.  Gave OJ, currently eating breakfast and 75mL D50.  Accidentally pulled from pyxis under room 305, but gave to this patient. Ordered D50 for this patient so can document on MAR. Dr. Informed.  Also, CBGs did not transfer to epic??  Recheck CBG 205 Dr. Informed - verbal orders to give base Novolog 10U and changed levemir to 40U to also give. Both given.  Lunch cbg 277  Dinner Cbg 45, 77ml D50 given + meal, Recheck 157. Dr. Jovita Gamma orders to hold base 10U. Patient continue to remain completely asymptomatic

## 2019-06-01 NOTE — Progress Notes (Signed)
[facility] PROGRESS NOTE    Progress Note  Austin [name]  WNU:[id number] DOB: 12/07/1960 DOA: 05/26/2019 PCP: Paulina Fusi, MD     Brief Narrative:   Austin [name] is an 59 y.o. male past medical history of chronic systolic heart failure, AICD, essential hypertension lives at home, relates that he has not been feeling well for 4 days prior to admission went to [facility] ED was found to be hypoxic with improvement in his oxygenation with 15 L of high flow nasal cannula.  Chest x-ray showed bilateral infiltrate and SARS-CoV-2 PCR was positive.  He was started empirically on IV remdesivir and steroids  Assessment/Plan:   Acute respiratory failure with hypoxia secondary to pneumonia due to COVID-19; [name] has now been weaned down to 1 L of oxygen nasal cannula this morning, his inflammatory markers are improved he has completed his course of IV remdesivir, will continue steroids for 1 additional day. Try to keep the patient prone for at least 16 hours a day if not prone out of bed to chair, continues into spirometry and a flutter valve. He relates his breathing is significantly better. His procalcitonin continues to be low, he is asking something to have a bowel movement we will give him MiraLAX. He has remained afebrile  Chronic systolic heart failure: Unknown EF, with an AICD in place I suspect his EF is less than 30%. His creatinine seems to be at baseline, blood pressure is well controlled.  Continue beta-blockers and Entresto.  Chronic kidney disease stage IIIa: Peers to be at baseline continue current medication.  New hyperkalemia: Currently on oral supplementations we will continue to monitor. It has resolved with Kayexalate.  Uncontrolled diabetes mellitus type 2: His blood glucose was extremely hard to control, this morning he had an episode of hypoglycemia will decrease his long-acting insulin continue resistant scale.  Dyslipidemia Continue statin  therapy.  GERD: Continue PPI.  Watery stool: Now resolved.   DVT prophylaxis: lovenox Family Communication: none Disposition Plan/Barrier to D/C: Hopefully home in 1 to 2 days Code Status:     Code Status Orders  (From admission, onward)         Start     Ordered   05/26/19 0956  Full code  Continuous     05/26/19 0957        Code Status History    This patient has a current code status but no historical code status.   Advance Care Planning Activity        IV Access:    Peripheral IV   Procedures and diagnostic studies:   DG CHEST PORT 1 VIEW  Result Date: 05/30/2019 CLINICAL DATA:  Leukocytosis EXAM: PORTABLE CHEST 1 VIEW COMPARISON:  05/26/2019 FINDINGS: Dual lead pacer/AICD device. Midline trachea. Moderate cardiomegaly. No pleural effusion or pneumothorax. Lower lung predominant interstitial opacities are slightly improved. Slightly worse on the right than left. IMPRESSION: Slightly improved aeration with persistent basilar predominant interstitial opacities. Pulmonary edema versus viral pneumonia. Electronically Signed   By: Jeronimo Greaves M.D.   On: 05/30/2019 15:08     Medical Consultants:    None.  Anti-Infectives:  IV remdesivir  Subjective:    [name] relates his breathing significantly better today.  Objective:    Vitals:   05/31/19 1600 05/31/19 1917 05/31/19 2105 06/01/19 0301  BP: 104/76 104/78 108/90 138/86  Pulse: 89 91  80  Resp: 14 16  16   Temp: 98.9 F (37.2 C) (!) 97 F (36.1  C)  97.6 F (36.4 C)  TempSrc: Oral Axillary  Oral  SpO2: 93%   97%  Weight:      Height:       SpO2: 97 % O2 Flow Rate (L/min): 3 L/min FiO2 (%): (!) 3 %   Intake/Output Summary (Last 24 hours) at 06/01/2019 0821 Last data filed at 06/01/2019 0600 Gross per 24 hour  Intake 900 ml  Output 1250 ml  Net -350 ml   Filed Weights   05/26/19 1020  Weight: 86.2 kg    Exam: General exam: In no acute distress. Respiratory system: Good  air movement and clear to auscultation. Cardiovascular system: S1 & S2 heard, RRR. No JVD. Gastrointestinal system: Abdomen is nondistended, soft and nontender.  Central nervous system: Alert and oriented. No focal neurological deficits. Extremities: No pedal edema. Skin: No rashes, lesions or ulcers Psychiatry: Judgement and insight appear normal. Mood & affect appropriate.     Data Reviewed:    Labs: Basic Metabolic Panel: Recent Labs  Lab 05/27/19 0307 05/28/19 0545 05/28/19 1705 05/29/19 0950 05/30/19 0410  NA 134* 135 138 140 139  K 4.6 5.2* 4.4 4.6 4.2  CL 100 98 107 104 100  CO2 24 22 22 23 25   GLUCOSE 258* 256* 289* 190* 46*  BUN 44* 49* 46* 43* 41*  CREATININE 1.29* 1.36* 1.58* 1.35* 1.32*  CALCIUM 8.6* 8.9 8.9 9.1 9.2  MG 2.9* 2.6*  --  2.3 2.6*   GFR Estimated Creatinine Clearance: 64.2 mL/min (A) (by C-G formula based on SCr of 1.32 mg/dL (H)). Liver Function Tests: Recent Labs  Lab 05/26/19 1030 05/27/19 0307 05/28/19 0545 05/29/19 0950 05/30/19 0410  AST 33 33 47* 65* 73*  ALT 34 31 39 51* 61*  ALKPHOS 52 51 52 58 60  BILITOT 0.8 1.0 1.1 0.6 0.6  PROT 8.5* 8.3* 7.9 7.9 8.0  ALBUMIN 2.9* 2.8* 2.9* 2.9* 3.1*   No results for input(s): LIPASE, AMYLASE in the last 168 hours. No results for input(s): AMMONIA in the last 168 hours. Coagulation profile No results for input(s): INR, PROTIME in the last 168 hours. COVID-19 Labs  Recent Labs    05/29/19 0950 05/30/19 0410  DDIMER 1.62* 1.51*  CRP 4.1* 3.4*    No results found for: SARSCOV2NAA  CBC: Recent Labs  Lab 05/27/19 0307 05/28/19 0545 05/29/19 0950 05/30/19 0410 05/31/19 0527  WBC 10.9* 12.5* 13.9* 20.8* 19.4*  NEUTROABS 9.6* 11.3* 12.3* 18.0* 17.0*  HGB 13.8 15.0 15.5 16.3 16.1  HCT 43.4 47.9 49.0 51.8 51.2  MCV 95.0 94.9 95.1 96.3 97.2  PLT 329 334 319 371 329   Cardiac Enzymes: No results for input(s): CKTOTAL, CKMB, CKMBINDEX, TROPONINI in the last 168 hours. BNP (last 3  results) No results for input(s): PROBNP in the last 8760 hours. CBG: Recent Labs  Lab 05/30/19 1209 05/30/19 1605 05/30/19 2121 05/31/19 1151 05/31/19 2052  GLUCAP 147* 85 126* 149* 92   D-Dimer: Recent Labs    05/29/19 0950 05/30/19 0410  DDIMER 1.62* 1.51*   Hgb A1c: No results for input(s): HGBA1C in the last 72 hours. Lipid Profile: No results for input(s): CHOL, HDL, LDLCALC, TRIG, CHOLHDL, LDLDIRECT in the last 72 hours. Thyroid function studies: No results for input(s): TSH, T4TOTAL, T3FREE, THYROIDAB in the last 72 hours.  Invalid input(s): FREET3 Anemia work up: No results for input(s): VITAMINB12, FOLATE, FERRITIN, TIBC, IRON, RETICCTPCT in the last 72 hours. Sepsis Labs: Recent Labs  Lab 05/26/19 1030 05/28/19 0545 05/29/19  7001 05/30/19 0410 05/31/19 0500 05/31/19 0527 06/01/19 0610  PROCALCITON 0.44  --   --  0.17 0.16  --  0.17  WBC  --  12.5* 13.9* 20.8*  --  19.4*  --    Microbiology No results found for this or any previous visit (from the past 240 hour(s)).   Medications:   . albuterol  2 puff Inhalation Q6H  . allopurinol  300 mg Oral Daily  . aspirin EC  81 mg Oral Daily  . dexamethasone  6 mg Oral Daily  . enoxaparin (LOVENOX) injection  40 mg Subcutaneous Daily  . furosemide  20 mg Oral Daily  . hydrALAZINE  37.5 mg Oral TID  . insulin aspart  0-20 Units Subcutaneous TID WC  . insulin aspart  0-5 Units Subcutaneous QHS  . insulin aspart  10 Units Subcutaneous TID WC  . insulin detemir  65 Units Subcutaneous BID  . metoprolol succinate  50 mg Oral Daily  . pantoprazole  40 mg Oral Daily  . sacubitril-valsartan  1 tablet Oral Daily  . simvastatin  40 mg Oral Daily  . sodium chloride flush  10-40 mL Intracatheter Q12H  . sodium chloride flush  3 mL Intravenous Q12H   Continuous Infusions:     LOS: 6 days   Charlynne Cousins  [facility]  06/01/2019, 8:21 AM

## 2019-06-01 NOTE — Progress Notes (Signed)
   06/01/19 2201  Provider Notification  Provider Name/Title R. Onalee Hua  Date Provider Notified 06/01/19  Time Provider Notified 2202  Notification Type Page  Notification Reason Other (Comment) (LUE with edema, below site of midline)  LUE edema AC space, below site of midline IV. Flushing without resistance, denies pain, no blood return noted. No increasing edema/infiltrating noted with flushing. Equal bilateral +2 radial pulses.

## 2019-06-02 LAB — GLUCOSE, CAPILLARY
Glucose-Capillary: 121 mg/dL — ABNORMAL HIGH (ref 70–99)
Glucose-Capillary: 202 mg/dL — ABNORMAL HIGH (ref 70–99)
Glucose-Capillary: 136 mg/dL — ABNORMAL HIGH (ref 70–99)
Glucose-Capillary: 46 mg/dL — ABNORMAL LOW (ref 70–99)
Glucose-Capillary: 207 mg/dL — ABNORMAL HIGH (ref 70–99)

## 2019-06-02 MED ORDER — INSULIN DETEMIR 100 UNIT/ML ~~LOC~~ SOLN
20.0000 [IU] | Freq: Every day | SUBCUTANEOUS | Status: DC
  Administered 2019-06-02: 20 [IU] via SUBCUTANEOUS
  Filled 2019-06-02: qty 0.2

## 2019-06-02 MED ORDER — HYALURONIDASE OVINE 200 UNIT/ML IJ SOLN
40.0000 [IU] | INTRAMUSCULAR | Status: AC
  Administered 2019-06-02 (×5): 40 [IU] via SUBCUTANEOUS
  Filled 2019-06-02 (×5): qty 0.2

## 2019-06-02 MED ORDER — METFORMIN HCL 500 MG PO TABS
500.0000 mg | ORAL_TABLET | Freq: Two times a day (BID) | ORAL | Status: DC
  Administered 2019-06-02 – 2019-06-04 (×4): 500 mg via ORAL
  Filled 2019-06-02 (×4): qty 1

## 2019-06-02 MED ORDER — DEXTROSE 50 % IV SOLN
INTRAVENOUS | Status: AC
  Administered 2019-06-02: 50 mL
  Filled 2019-06-02: qty 50

## 2019-06-02 MED ORDER — HYALURONIDASE OVINE 200 UNIT/ML IJ SOLN
40.0000 [IU] | INTRAMUSCULAR | Status: DC
  Filled 2019-06-02 (×5): qty 0.2

## 2019-06-02 NOTE — Progress Notes (Signed)
   06/02/19 0139  Provider Notification  Provider Name/Title R. Onalee Hua  Date Provider Notified 06/02/19  Time Provider Notified 0140  Notification Type Page  Notification Reason Other (Comment) (IV d/c'd catheter tip pointed distally, toward area of edema)  IV team arrived to assess current midline patency. Midline d/c'd by team, noting that catheter tip was coiled downward/distally towards area of edema in left antecubital space, suspect infiltration. Pt received D50 amps dayshift, vesicant per pharmacist. MD notified of pharmacist recommendations Na Hyaluronidase. LUE elevated and cold compress applied per pharmacist.

## 2019-06-02 NOTE — Progress Notes (Signed)
[facility] PROGRESS NOTE    Progress Note  Austin [name]  BJS:[id number] DOB: 1960/09/03 DOA: 05/26/2019 PCP: Paulina Fusi, MD     Brief Narrative:   Austin [name] is an 59 y.o. male past medical history of chronic systolic heart failure, AICD, essential hypertension lives at home, relates that he has not been feeling well for 4 days prior to admission went to [facility] ED was found to be hypoxic with improvement in his oxygenation with 15 L of high flow nasal cannula.  Chest x-ray showed bilateral infiltrate and SARS-CoV-2 PCR was positive.  He was started empirically on IV remdesivir and steroids  Assessment/Plan:   Acute respiratory failure with hypoxia secondary to pneumonia due to COVID-19; Austin [name] has now been weaned down to room air now requiring 1-2 L of oxygen nasal cannula this morning, his inflammatory markers are improved he has completed his course of IV remdesivir, will continue steroids for 1 additional day. Try to keep the patient prone for at least 16 hours a day if not prone out of bed to chair, continues into spirometry and a flutter valve. He relates his breathing is significantly better. His procalcitonin continues to be low, he is asking something to have a bowel movement we will give him MiraLAX. He has remained afebrile.   Wean him to room air.  Chronic systolic heart failure: Unknown EF, with an AICD in place I suspect his EF is less than 30%. His creatinine seems to be at baseline, blood pressure is well controlled.  Continue beta-blockers and Entresto.  Chronic kidney disease stage IIIa: Peers to be at baseline continue current medication.  New hyperkalemia: Currently on oral supplementations we will continue to monitor. It has resolved with Kayexalate.  Uncontrolled diabetes mellitus type 2 with hypoglycemia: His blood glucose was extremely hard to control, this morning he had an episode of asymptomatic hypoglycemia will discontinue  insulin and start him on low-dose metformin.  Dyslipidemia Continue statin therapy.  GERD: Continue PPI.  Watery stool: Now resolved.   DVT prophylaxis: lovenox Family Communication: none Disposition Plan/Barrier to D/C: Hopefully home in 1 to 2 days Code Status:     Code Status Orders  (From admission, onward)         Start     Ordered   05/26/19 0956  Full code  Continuous     05/26/19 0957        Code Status History    This patient has a current code status but no historical code status.   Advance Care Planning Activity        IV Access:    Peripheral IV   Procedures and diagnostic studies:   No results found.   Medical Consultants:    None.  Anti-Infectives:  IV remdesivir  Subjective:    Austin [name] no complaints this morning.  Objective:    Vitals:   06/02/19 0328 06/02/19 0333 06/02/19 0334 06/02/19 0700  BP: 114/85   126/85  Pulse: 91   85  Resp: 20   18  Temp: 98.4 F (36.9 C)   97.9 F (36.6 C)  TempSrc: Oral     SpO2: 97% (!) 89% 93% 95%  Weight:      Height:       SpO2: 95 %(taken by jackie edwards) O2 Flow Rate (L/min): 2 L/min FiO2 (%): (!) 3 %   Intake/Output Summary (Last 24 hours) at 06/02/2019 0817 Last data filed at 06/02/2019 0329 Gross per 24  hour  Intake 360 ml  Output 550 ml  Net -190 ml   Filed Weights   05/26/19 1020  Weight: 86.2 kg    Exam: General exam: In no acute distress. Respiratory system: Good air movement and diffuse crackles bilaterally. Cardiovascular system: S1 & S2 heard, RRR. No JVD. Gastrointestinal system: Abdomen is nondistended, soft and nontender.  Central nervous system: Alert and oriented. No focal neurological deficits. Extremities: No pedal edema. Skin: No rashes, lesions or ulcers Psychiatry: Judgement and insight appear normal. Mood & affect appropriate.    Data Reviewed:    Labs: Basic Metabolic Panel: Recent Labs  Lab 05/27/19 0307 05/28/19 0545  05/28/19 1705 05/29/19 0950 05/30/19 0410  NA 134* 135 138 140 139  K 4.6 5.2* 4.4 4.6 4.2  CL 100 98 107 104 100  CO2 24 22 22 23 25   GLUCOSE 258* 256* 289* 190* 46*  BUN 44* 49* 46* 43* 41*  CREATININE 1.29* 1.36* 1.58* 1.35* 1.32*  CALCIUM 8.6* 8.9 8.9 9.1 9.2  MG 2.9* 2.6*  --  2.3 2.6*   GFR Estimated Creatinine Clearance: 64.2 mL/min (A) (by C-G formula based on SCr of 1.32 mg/dL (H)). Liver Function Tests: Recent Labs  Lab 05/26/19 1030 05/27/19 0307 05/28/19 0545 05/29/19 0950 05/30/19 0410  AST 33 33 47* 65* 73*  ALT 34 31 39 51* 61*  ALKPHOS 52 51 52 58 60  BILITOT 0.8 1.0 1.1 0.6 0.6  PROT 8.5* 8.3* 7.9 7.9 8.0  ALBUMIN 2.9* 2.8* 2.9* 2.9* 3.1*   No results for input(s): LIPASE, AMYLASE in the last 168 hours. No results for input(s): AMMONIA in the last 168 hours. Coagulation profile No results for input(s): INR, PROTIME in the last 168 hours. COVID-19 Labs  No results for input(s): DDIMER, FERRITIN, LDH, CRP in the last 72 hours.  No results found for: SARSCOV2NAA  CBC: Recent Labs  Lab 05/27/19 0307 05/28/19 0545 05/29/19 0950 05/30/19 0410 05/31/19 0527  WBC 10.9* 12.5* 13.9* 20.8* 19.4*  NEUTROABS 9.6* 11.3* 12.3* 18.0* 17.0*  HGB 13.8 15.0 15.5 16.3 16.1  HCT 43.4 47.9 49.0 51.8 51.2  MCV 95.0 94.9 95.1 96.3 97.2  PLT 329 334 319 371 329   Cardiac Enzymes: No results for input(s): CKTOTAL, CKMB, CKMBINDEX, TROPONINI in the last 168 hours. BNP (last 3 results) No results for input(s): PROBNP in the last 8760 hours. CBG: Recent Labs  Lab 06/01/19 1129 06/01/19 1705 06/01/19 1800 06/01/19 2103 06/02/19 0058  GLUCAP 277* 45* 156* 178* 121*   D-Dimer: No results for input(s): DDIMER in the last 72 hours. Hgb A1c: No results for input(s): HGBA1C in the last 72 hours. Lipid Profile: No results for input(s): CHOL, HDL, LDLCALC, TRIG, CHOLHDL, LDLDIRECT in the last 72 hours. Thyroid function studies: No results for input(s): TSH,  T4TOTAL, T3FREE, THYROIDAB in the last 72 hours.  Invalid input(s): FREET3 Anemia work up: No results for input(s): VITAMINB12, FOLATE, FERRITIN, TIBC, IRON, RETICCTPCT in the last 72 hours. Sepsis Labs: Recent Labs  Lab 05/26/19 1030 05/28/19 0545 05/29/19 0950 05/30/19 0410 05/31/19 0500 05/31/19 0527 06/01/19 0610  PROCALCITON 0.44  --   --  0.17 0.16  --  0.17  WBC  --  12.5* 13.9* 20.8*  --  19.4*  --    Microbiology No results found for this or any previous visit (from the past 240 hour(s)).   Medications:   . dextrose      . albuterol  2 puff Inhalation Q6H  .  allopurinol  300 mg Oral Daily  . aspirin EC  81 mg Oral Daily  . dexamethasone  6 mg Oral Daily  . dextrose  25 mL Intravenous Once  . enoxaparin (LOVENOX) injection  40 mg Subcutaneous Daily  . furosemide  20 mg Oral Daily  . hydrALAZINE  37.5 mg Oral TID  . insulin aspart  0-20 Units Subcutaneous TID WC  . insulin aspart  0-5 Units Subcutaneous QHS  . insulin aspart  10 Units Subcutaneous TID WC  . insulin detemir  20 Units Subcutaneous Daily  . metoprolol succinate  50 mg Oral Daily  . pantoprazole  40 mg Oral Daily  . polyethylene glycol  17 g Oral BID  . sacubitril-valsartan  1 tablet Oral Daily  . simvastatin  40 mg Oral Daily  . sodium chloride flush  10-40 mL Intracatheter Q12H  . sodium chloride flush  3 mL Intravenous Q12H   Continuous Infusions:     LOS: 7 days   Marinda Elk  [facility]  06/02/2019, 8:17 AM

## 2019-06-02 NOTE — Plan of Care (Signed)
  Problem: Education: Goal: Knowledge of General Education information will improve Description: Including pain rating scale, medication(s)/side effects and non-pharmacologic comfort measures Outcome: Progressing   Problem: Health Behavior/Discharge Planning: Goal: Ability to manage health-related needs will improve Outcome: Progressing   Problem: Nutrition: Goal: Adequate nutrition will be maintained Outcome: Progressing   Problem: Elimination: Goal: Will not experience complications related to bowel motility Outcome: Progressing   Problem: Safety: Goal: Ability to remain free from injury will improve Outcome: Progressing   Problem: Skin Integrity: Goal: Risk for impaired skin integrity will decrease Outcome: Progressing   

## 2019-06-02 NOTE — Plan of Care (Addendum)
Per shift change report, Given and ate 2nd dinner about 10PM. Yet CBG this AM is 45.  Gave 78ml of D50IV and OJ/Graham crackers.  Still totally asymptomatic.  Dr. Informed, will recheck and continue to assess. Recheck 202, Verbal orders to HOLD 10U base novolog. Patient stable  Lunch cbg 137 - Verbal orders to hold all insulin.  Insulin also DCd and metformin restarted. Metformin given.

## 2019-06-03 DIAGNOSIS — E875 Hyperkalemia: Secondary | ICD-10-CM

## 2019-06-03 DIAGNOSIS — J1282 Pneumonia due to coronavirus disease 2019: Secondary | ICD-10-CM

## 2019-06-03 LAB — GLUCOSE, CAPILLARY
Glucose-Capillary: 284 mg/dL — ABNORMAL HIGH (ref 70–99)
Glucose-Capillary: 96 mg/dL (ref 70–99)
Glucose-Capillary: 148 mg/dL — ABNORMAL HIGH (ref 70–99)
Glucose-Capillary: 188 mg/dL — ABNORMAL HIGH (ref 70–99)
Glucose-Capillary: 176 mg/dL — ABNORMAL HIGH (ref 70–99)

## 2019-06-03 LAB — BASIC METABOLIC PANEL
Anion gap: 11 (ref 5–15)
BUN: 32 mg/dL — ABNORMAL HIGH (ref 6–20)
CO2: 28 mmol/L (ref 22–32)
Calcium: 9.3 mg/dL (ref 8.9–10.3)
Chloride: 96 mmol/L — ABNORMAL LOW (ref 98–111)
Creatinine, Ser: 1.17 mg/dL (ref 0.61–1.24)
GFR calc Af Amer: >60 mL/min (ref >60)
GFR calc non Af Amer: >60 mL/min (ref >60)
Glucose, Bld: 134 mg/dL — ABNORMAL HIGH (ref 70–99)
Potassium: 5.4 mmol/L — ABNORMAL HIGH (ref 3.5–5.1)
Sodium: 135 mmol/L (ref 135–145)

## 2019-06-03 MED ORDER — ENSURE MAX PROTEIN PO LIQD
11.0000 [oz_av] | Freq: Two times a day (BID) | ORAL | Status: DC
  Filled 2019-06-03 (×2): qty 330

## 2019-06-03 MED ORDER — SODIUM ZIRCONIUM CYCLOSILICATE 10 G PO PACK
10.0000 g | PACK | Freq: Once | ORAL | Status: AC
  Administered 2019-06-03: 10 g via ORAL
  Filled 2019-06-03: qty 1

## 2019-06-03 NOTE — Progress Notes (Signed)
[facility] PROGRESS NOTE    Progress Note  Austin [name]  DZH:[id number] DOB: 1960-09-23 DOA: 05/26/2019 PCP: Paulina Fusi, MD     Brief Narrative:   Austin [name] is an 59 y.o. male past medical history of chronic systolic heart failure, AICD, essential hypertension lives at home, relates that he has not been feeling well for 4 days prior to admission went to [facility] ED was found to be hypoxic with improvement in his oxygenation with 15 L of high flow nasal cannula.  Chest x-ray showed bilateral infiltrate and SARS-CoV-2 PCR was positive.  He was started empirically on IV remdesivir and steroids  Assessment/Plan:   Acute respiratory failure with hypoxia secondary to pneumonia due to COVID-19; Patient was requiring 10 L of oxygen during some point during this hospitalization.  His inflammatory markers were significantly elevated.  Patient was placed on remdesivir and steroids.  He started improving slowly.  He is now down to 1 to 2 L of oxygen requiring mainly with exertion.  Home oxygen has been ordered.  Inflammatory markers improved with CRP down to 3.4.  D-dimer 1.51.  Procalcitonin 0.17.  Leukocytosis is due to steroids.  Recent Labs  Lab 05/28/19 0545 05/29/19 0950 05/30/19 0410 05/31/19 0500 06/01/19 0610  DDIMER 2.29* 1.62* 1.51*  --   --   CRP 9.1* 4.1* 3.4*  --   --   ALT 39 51* 61*  --   --   PROCALCITON  --   --  0.17 0.16 0.17    Chronic systolic heart failure/hyperkalemia Unknown EF, with an AICD in place. Suspect his EF is less than 30%.  Followed by cardiology and Shannon.  He is noted to be on Entresto.  Also on metoprolol.  He is on Lasix.  His potassium level noted to be elevated this morning.  He will be given a dose of Lokelma.  We will recheck tomorrow.  He is already on a lower dose of Entresto.  May need to hold it.  Chronic kidney disease stage IIIa: Renal function at baseline.  Monitor urine output.  Uncontrolled diabetes mellitus type  2 with hypoglycemia: HbA1c 7.7.  Patient not on any glucose lowering agents at home.  This appears to be a new diagnosis for him.  Patient switched over to Metformin from long-acting insulin due to hypoglycemia.  Continue to monitor.    Transaminitis Secondary to COVID-19.  Recheck in the outpatient setting.  Dyslipidemia Continue statin therapy.  GERD: Continue PPI.  Watery stool: Now resolved.   DVT prophylaxis: lovenox CODE STATUS: Full code Family Communication: none Disposition Plan: Hopefully home in 1 to 2 days   IV Access:    Peripheral IV   Procedures and diagnostic studies:   No results found.   Medical Consultants:    None.  Anti-Infectives:  IV remdesivir  Subjective:    Patient feels well.  Shortness of breath is improved.  Denies any chest pain.  Occasional dry cough.  No nausea vomiting.  Objective:    Vitals:   06/03/19 0910 06/03/19 0930 06/03/19 0935 06/03/19 0950  BP: 126/81     Pulse: 96     Resp:      Temp:      TempSrc:      SpO2:  94% (!) 84% 94%  Weight:      Height:       SpO2: 94 % O2 Flow Rate (L/min): 0 L/min FiO2 (%): (!) 3 %   Intake/Output Summary (Last  24 hours) at 06/03/2019 1506 Last data filed at 06/03/2019 1400 Gross per 24 hour  Intake 723 ml  Output 2050 ml  Net -1327 ml   Filed Weights   05/26/19 1020  Weight: 86.2 kg    General appearance: Awake alert.  In no distress Resp: Normal effort at rest.  Few crackles at the bases.  No wheezing or rhonchi. Cardio: S1-S2 is normal regular.  No S3-S4.  No rubs murmurs or bruit GI: Abdomen is soft.  Nontender nondistended.  Bowel sounds are present normal.  No masses organomegaly Extremities: No edema.  Full range of motion of lower extremities. Neurologic: Alert and oriented x3.  No focal neurological deficits.     Data Reviewed:    Labs: Basic Metabolic Panel: Recent Labs  Lab 05/28/19 0545 05/28/19 1705 05/29/19 0950 05/30/19 0410  06/03/19 1140  NA 135 138 140 139 135  K 5.2* 4.4 4.6 4.2 5.4*  CL 98 107 104 100 96*  CO2 22 22 23 25 28   GLUCOSE 256* 289* 190* 46* 134*  BUN 49* 46* 43* 41* 32*  CREATININE 1.36* 1.58* 1.35* 1.32* 1.17  CALCIUM 8.9 8.9 9.1 9.2 9.3  MG 2.6*  --  2.3 2.6*  --    GFR Estimated Creatinine Clearance: 72.4 mL/min (by C-G formula based on SCr of 1.17 mg/dL). Liver Function Tests: Recent Labs  Lab 05/28/19 0545 05/29/19 0950 05/30/19 0410  AST 47* 65* 73*  ALT 39 51* 61*  ALKPHOS 52 58 60  BILITOT 1.1 0.6 0.6  PROT 7.9 7.9 8.0  ALBUMIN 2.9* 2.9* 3.1*    CBC: Recent Labs  Lab 05/28/19 0545 05/29/19 0950 05/30/19 0410 05/31/19 0527  WBC 12.5* 13.9* 20.8* 19.4*  NEUTROABS 11.3* 12.3* 18.0* 17.0*  HGB 15.0 15.5 16.3 16.1  HCT 47.9 49.0 51.8 51.2  MCV 94.9 95.1 96.3 97.2  PLT 334 319 371 329   CBG: Recent Labs  Lab 06/02/19 1154 06/02/19 1732 06/02/19 2048 06/03/19 0739 06/03/19 1142  GLUCAP 136* 207* 284* 96 148*   Sepsis Labs: Recent Labs  Lab 05/28/19 0545 05/29/19 0950 05/30/19 0410 05/31/19 0500 05/31/19 0527 06/01/19 0610  PROCALCITON  --   --  0.17 0.16  --  0.17  WBC 12.5* 13.9* 20.8*  --  19.4*  --    Microbiology No results found for this or any previous visit (from the past 240 hour(s)).   Medications:   . albuterol  2 puff Inhalation Q6H  . allopurinol  300 mg Oral Daily  . aspirin EC  81 mg Oral Daily  . dexamethasone  6 mg Oral Daily  . dextrose  25 mL Intravenous Once  . enoxaparin (LOVENOX) injection  40 mg Subcutaneous Daily  . furosemide  20 mg Oral Daily  . hydrALAZINE  37.5 mg Oral TID  . metFORMIN  500 mg Oral BID WC  . metoprolol succinate  50 mg Oral Daily  . pantoprazole  40 mg Oral Daily  . sacubitril-valsartan  1 tablet Oral Daily  . simvastatin  40 mg Oral Daily  . sodium chloride flush  10-40 mL Intracatheter Q12H  . sodium chloride flush  3 mL Intravenous Q12H  . sodium zirconium cyclosilicate  10 g Oral Once    Continuous Infusions:     LOS: 8 days   [employer]  [facility]  06/03/2019, 3:06 PM

## 2019-06-03 NOTE — Progress Notes (Addendum)
Initial Nutrition Assessment  DOCUMENTATION CODES:   Not applicable  INTERVENTION:    Ensure Max po BID, each supplement provides 150 kcal and 30 grams of protein.    Recommend re-weigh patient.  NUTRITION DIAGNOSIS:   Increased nutrient needs related to acute illness(COVID-19) as evidenced by estimated needs.  GOAL:   Patient will meet greater than or equal to 90% of their needs  MONITOR:   PO intake, Supplement acceptance, Weight trends  REASON FOR ASSESSMENT:   Malnutrition Screening Tool    ASSESSMENT:   59 yo male admitted with hypoxia, COVID-19 positive. PMH includes HF, AICD, HTN, DM, HLD.   Patient is on a heart healthy diet. He has been consuming 100% of meals for the past 3-4 days. Oxygen requirement is down to 1-2 L.   Wt Readings from Last 10 Encounters:  05/26/19 86.2 kg  03/09/19 124.7 kg  02/09/19 123.4 kg  10/29/18 127 kg  07/23/18 123.8 kg  05/05/13 133.8 kg  03/13/13 (!) 138.9 kg    Usual weights reviewed. Unsure accuracy of weight recorded on 12/29. It is 31% below weight from 2.5 months prior.  Intake of meals appears good now and intake PTA was not reported as poor. Weight loss could be attributed to volume status with hx of HF, however, weight has been stable for the past year, ranging from ~123 kg to 127 kg.  Attempted to call patient, but no answer x 3 calls.    Labs reviewed. Potassium 5.4 (H) CBG's: [phone]  Medications reviewed and include glucophage, lasix, decadron.  Patient would benefit from PO supplements to ensure adequate protein intake to meet increased protein needs for COVID-19.  NUTRITION - FOCUSED PHYSICAL EXAM:  unable to complete due to COVID restrictions  Diet Order:   Diet Order            Diet Heart Room service appropriate? Yes; Fluid consistency: Thin; Fluid restriction: 1200 mL Fluid  Diet effective now              EDUCATION NEEDS:   Not appropriate for education at this time  Skin:  Skin  Assessment: Reviewed RN Assessment  Last BM:  1/5  Height:   Ht Readings from Last 1 Encounters:  05/26/19 5\' 11"  (1.803 m)    Weight:   Wt Readings from Last 1 Encounters:  05/26/19 86.2 kg    Ideal Body Weight:  78.2 kg  BMI:  Body mass index is 26.5 kg/m.  Estimated Nutritional Needs:   Kcal:  2400-2600  Protein:  125-145 gm  Fluid:  >/= 2.4 L    05/28/19, RD, LDN, CNSC Pager [phone] After Hours Pager [phone]

## 2019-06-03 NOTE — Plan of Care (Signed)
  Problem: Activity: Goal: Risk for activity intolerance will decrease Outcome: Progressing   Problem: Nutrition: Goal: Adequate nutrition will be maintained Outcome: Progressing   Problem: Safety: Goal: Ability to remain free from injury will improve Outcome: Progressing   Problem: Skin Integrity: Goal: Risk for impaired skin integrity will decrease Outcome: Progressing   

## 2019-06-04 LAB — CBC
HCT: 49.5 % (ref 39.0–52.0)
Hemoglobin: 15.5 g/dL (ref 13.0–17.0)
MCH: 29.9 pg (ref 26.0–34.0)
MCHC: 31.3 g/dL (ref 30.0–36.0)
MCV: 95.4 fL (ref 80.0–100.0)
Platelets: 268 10*3/uL (ref 150–400)
RBC: 5.19 MIL/uL (ref 4.22–5.81)
RDW: 13.4 % (ref 11.5–15.5)
WBC: 14.2 10*3/uL — ABNORMAL HIGH (ref 4.0–10.5)
nRBC: 0 % (ref 0.0–0.2)

## 2019-06-04 LAB — POTASSIUM: Potassium: 4.8 mmol/L (ref 3.5–5.1)

## 2019-06-04 LAB — BASIC METABOLIC PANEL
Anion gap: 11 (ref 5–15)
BUN: 34 mg/dL — ABNORMAL HIGH (ref 6–20)
CO2: 28 mmol/L (ref 22–32)
Calcium: 9.5 mg/dL (ref 8.9–10.3)
Chloride: 94 mmol/L — ABNORMAL LOW (ref 98–111)
Creatinine, Ser: 1.28 mg/dL — ABNORMAL HIGH (ref 0.61–1.24)
GFR calc Af Amer: >60 mL/min (ref >60)
GFR calc non Af Amer: >60 mL/min (ref >60)
Glucose, Bld: 132 mg/dL — ABNORMAL HIGH (ref 70–99)
Potassium: 5.4 mmol/L — ABNORMAL HIGH (ref 3.5–5.1)
Sodium: 133 mmol/L — ABNORMAL LOW (ref 135–145)

## 2019-06-04 LAB — GLUCOSE, CAPILLARY
Glucose-Capillary: 109 mg/dL — ABNORMAL HIGH (ref 70–99)
Glucose-Capillary: 140 mg/dL — ABNORMAL HIGH (ref 70–99)

## 2019-06-04 MED ORDER — DEXAMETHASONE 2 MG PO TABS: ORAL_TABLET | ORAL | 0 refills | Status: DC

## 2019-06-04 MED ORDER — METFORMIN HCL 500 MG PO TABS: 500.0000 mg | ORAL_TABLET | Freq: Two times a day (BID) | ORAL | 1 refills | Status: AC

## 2019-06-04 MED ORDER — SODIUM POLYSTYRENE SULFONATE 15 GM/60ML PO SUSP
30.0000 g | Freq: Once | ORAL | Status: AC
  Administered 2019-06-04: 30 g via ORAL
  Filled 2019-06-04: qty 120

## 2019-06-04 NOTE — Progress Notes (Signed)
SATURATION QUALIFICATIONS: (This note is used to comply with regulatory documentation for home oxygen)  Patient Saturations on Room Air at Rest = 94%  Patient Saturations on Room Air while Ambulating = 84%  Patient Saturations on 2 Liters of oxygen while Ambulating = 94%  Please briefly explain why patient needs home oxygen: Pt's saturations fell below 88% with exertion. Pt requires 2L of O2 with exertion to maintain saturations at 88% and above.   Cristopher Peru, M.S., ACSM CEP

## 2019-06-04 NOTE — Progress Notes (Signed)
Removed PIV, went over DC instructions. Took patient down to where fiance was waiting. Showed both fiance, daughter and patient how to use O2 tank. Assisted into car. No distress noted.

## 2019-06-04 NOTE — Discharge Instructions (Signed)
Type 2 Diabetes Mellitus, Diagnosis, Adult Type 2 diabetes (type 2 diabetes mellitus) is a long-term (chronic) disease. In type 2 diabetes, one or both of these problems may be present:  The pancreas does not make enough of a hormone called insulin.  Cells in the body do not respond properly to insulin that the body makes (insulin resistance). Normally, insulin allows blood sugar (glucose) to enter cells in the body. The cells use glucose for energy. Insulin resistance or lack of insulin causes excess glucose to build up in the blood instead of going into cells. As a result, high blood glucose (hyperglycemia) develops. What increases the risk? The following factors may make you more likely to develop type 2 diabetes:  Having a family member with type 2 diabetes.  Being overweight or obese.  Having an inactive (sedentary) lifestyle.  Having been diagnosed with insulin resistance.  Having a history of prediabetes, gestational diabetes, or polycystic ovary syndrome (PCOS).  Being of American-Indian, African-American, Hispanic/Latino, or Asian/Pacific Islander descent. What are the signs or symptoms? In the early stage of this condition, you may not have symptoms. Symptoms develop slowly and may include:  Increased thirst (polydipsia).  Increased hunger(polyphagia).  Increased urination (polyuria).  Increased urination during the night (nocturia).  Unexplained weight loss.  Frequent infections that keep coming back (recurring).  Fatigue.  Weakness.  Vision changes, such as blurry vision.  Cuts or bruises that are slow to heal.  Tingling or numbness in the hands or feet.  Dark patches on the skin (acanthosis nigricans). How is this diagnosed? This condition is diagnosed based on your symptoms, your medical history, a physical exam, and your blood glucose level. Your blood glucose may be checked with one or more of the following blood tests:  A fasting blood glucose (FBG)  test. You will not be allowed to eat (you will fast) for 8 hours or longer before a blood sample is taken.  A random blood glucose test. This test checks blood glucose at any time of day regardless of when you ate.  An A1c (hemoglobin A1c) blood test. This test provides information about blood glucose control over the previous 2-3 months.  An oral glucose tolerance test (OGTT). This test measures your blood glucose at two times: ? After fasting. This is your baseline blood glucose level. ? Two hours after drinking a beverage that contains glucose. You may be diagnosed with type 2 diabetes if:  Your FBG level is 126 mg/dL (7.0 mmol/L) or higher.  Your random blood glucose level is 200 mg/dL (11.1 mmol/L) or higher.  Your A1c level is 6.5% or higher.  Your OGTT result is higher than 200 mg/dL (11.1 mmol/L). These blood tests may be repeated to confirm your diagnosis. How is this treated? Your treatment may be managed by a specialist called an endocrinologist. Type 2 diabetes may be treated by following instructions from your health care provider about:  Making diet and lifestyle changes. This may include: ? Following an individualized nutrition plan that is developed by a diet and nutrition specialist (registered dietitian). ? Exercising regularly. ? Finding ways to manage stress.  Checking your blood glucose level as often as told.  Taking diabetes medicines or insulin daily. This helps to keep your blood glucose levels in the healthy range. ? If you use insulin, you may need to adjust the dosage depending on how physically active you are and what foods you eat. Your health care provider will tell you how to adjust your dosage.    Taking medicines to help prevent complications from diabetes, such as: ? Aspirin. ? Medicine to lower cholesterol. ? Medicine to control blood pressure. Your health care provider will set individualized treatment goals for you. Your goals will be based on  your age, other medical conditions you have, and how you respond to diabetes treatment. Generally, the goal of treatment is to maintain the following blood glucose levels:  Before meals (preprandial): 80-130 mg/dL (5.4-6.5 mmol/L).  After meals (postprandial): below 180 mg/dL (10 mmol/L).  A1c level: less than 7%. Follow these instructions at home: Questions to ask your health care provider  Consider asking the following questions: ? Do I need to meet with a diabetes educator? ? Where can I find a support group for people with diabetes? ? What equipment will I need to manage my diabetes at home? ? What diabetes medicines do I need, and when should I take them? ? How often do I need to check my blood glucose? ? What number can I call if I have questions? ? When is my next appointment? General instructions  Take over-the-counter and prescription medicines only as told by your health care provider.  Keep all follow-up visits as told by your health care provider. This is important.  For more information about diabetes, visit: ? American Diabetes Association (ADA): www.diabetes.org ? American Association of Diabetes Educators (AADE): www.diabeteseducator.org Contact a health care provider if:  Your blood glucose is at or above 240 mg/dL (03.5 mmol/L) for 2 days in a row.  You have been sick or have had a fever for 2 days or longer, and you are not getting better.  You have any of the following problems for more than 6 hours: ? You cannot eat or drink. ? You have nausea and vomiting. ? You have diarrhea. Get help right away if:  Your blood glucose is lower than 54 mg/dL (3.0 mmol/L).  You become confused or you have trouble thinking clearly.  You have difficulty breathing.  You have moderate or large ketone levels in your urine. Summary  Type 2 diabetes (type 2 diabetes mellitus) is a long-term (chronic) disease. In type 2 diabetes, the pancreas does not make enough of a  hormone called insulin, or cells in the body do not respond properly to insulin that the body makes (insulin resistance).  This condition is treated by making diet and lifestyle changes and taking diabetes medicines or insulin.  Your health care provider will set individualized treatment goals for you. Your goals will be based on your age, other medical conditions you have, and how you respond to diabetes treatment.  Keep all follow-up visits as told by your health care provider. This is important. This information is not intended to replace advice given to you by your health care provider. Make sure you discuss any questions you have with your health care provider. Document Revised: 07/12/2017 Document Reviewed: 06/17/2015 Elsevier Patient Education  2020 [employer]   COVID-19 COVID-19 is a respiratory infection that is caused by a virus called severe acute respiratory syndrome coronavirus 2 (SARS-CoV-2). The disease is also known as coronavirus disease or novel coronavirus. In some people, the virus may not cause any symptoms. In others, it may cause a serious infection. The infection can get worse quickly and can lead to complications, such as:  Pneumonia, or infection of the lungs.  Acute respiratory distress syndrome or ARDS. This is a condition in which fluid build-up in the lungs prevents the lungs from filling with air  and passing oxygen into the blood.  Acute respiratory failure. This is a condition in which there is not enough oxygen passing from the lungs to the body or when carbon dioxide is not passing from the lungs out of the body.  Sepsis or septic shock. This is a serious bodily reaction to an infection.  Blood clotting problems.  Secondary infections due to bacteria or fungus.  Organ failure. This is when your body's organs stop working. The virus that causes COVID-19 is contagious. This means that it can spread from person to person through droplets from coughs and  sneezes (respiratory secretions). What are the causes? This illness is caused by a virus. You may catch the virus by:  Breathing in droplets from an infected person. Droplets can be spread by a person breathing, speaking, singing, coughing, or sneezing.  Touching something, like a table or a doorknob, that was exposed to the virus (contaminated) and then touching your mouth, nose, or eyes. What increases the risk? Risk for infection You are more likely to be infected with this virus if you:  Are within 6 feet (2 meters) of a person with COVID-19.  Provide care for or live with a person who is infected with COVID-19.  Spend time in crowded indoor spaces or live in shared housing. Risk for serious illness You are more likely to become seriously ill from the virus if you:  Are 88 years of age or older. The higher your age, the more you are at risk for serious illness.  Live in a nursing home or long-term care facility.  Have cancer.  Have a long-term (chronic) disease such as: ? Chronic lung disease, including chronic obstructive pulmonary disease or asthma. ? A long-term disease that lowers your body's ability to fight infection (immunocompromised). ? Heart disease, including heart failure, a condition in which the arteries that lead to the heart become narrow or blocked (coronary artery disease), a disease which makes the heart muscle thick, weak, or stiff (cardiomyopathy). ? Diabetes. ? Chronic kidney disease. ? Sickle cell disease, a condition in which red blood cells have an abnormal "sickle" shape. ? Liver disease.  Are obese. What are the signs or symptoms? Symptoms of this condition can range from mild to severe. Symptoms may appear any time from 2 to 14 days after being exposed to the virus. They include:  A fever or chills.  A cough.  Difficulty breathing.  Headaches, body aches, or muscle aches.  Runny or stuffy (congested) nose.  A sore throat.  New loss of  taste or smell. Some people may also have stomach problems, such as nausea, vomiting, or diarrhea. Other people may not have any symptoms of COVID-19. How is this diagnosed? This condition may be diagnosed based on:  Your signs and symptoms, especially if: ? You live in an area with a COVID-19 outbreak. ? You recently traveled to or from an area where the virus is common. ? You provide care for or live with a person who was diagnosed with COVID-19. ? You were exposed to a person who was diagnosed with COVID-19.  A physical exam.  Lab tests, which may include: ? Taking a sample of fluid from the back of your nose and throat (nasopharyngeal fluid), your nose, or your throat using a swab. ? A sample of mucus from your lungs (sputum). ? Blood tests.  Imaging tests, which may include, X-rays, CT scan, or ultrasound. How is this treated? At present, there is no medicine to  treat COVID-19. Medicines that treat other diseases are being used on a trial basis to see if they are effective against COVID-19. Your health care provider will talk with you about ways to treat your symptoms. For most people, the infection is mild and can be managed at home with rest, fluids, and over-the-counter medicines. Treatment for a serious infection usually takes places in a hospital intensive care unit (ICU). It may include one or more of the following treatments. These treatments are given until your symptoms improve.  Receiving fluids and medicines through an IV.  Supplemental oxygen. Extra oxygen is given through a tube in the nose, a face mask, or a hood.  Positioning you to lie on your stomach (prone position). This makes it easier for oxygen to get into the lungs.  Continuous positive airway pressure (CPAP) or bi-level positive airway pressure (BPAP) machine. This treatment uses mild air pressure to keep the airways open. A tube that is connected to a motor delivers oxygen to the body.  Ventilator. This  treatment moves air into and out of the lungs by using a tube that is placed in your windpipe.  Tracheostomy. This is a procedure to create a hole in the neck so that a breathing tube can be inserted.  Extracorporeal membrane oxygenation (ECMO). This procedure gives the lungs a chance to recover by taking over the functions of the heart and lungs. It supplies oxygen to the body and removes carbon dioxide. Follow these instructions at home: Lifestyle  If you are sick, stay home except to get medical care. Your health care provider will tell you how long to stay home. Call your health care provider before you go for medical care.  Rest at home as told by your health care provider.  Do not use any products that contain nicotine or tobacco, such as cigarettes, e-cigarettes, and chewing tobacco. If you need help quitting, ask your health care provider.  Return to your normal activities as told by your health care provider. Ask your health care provider what activities are safe for you. General instructions  Take over-the-counter and prescription medicines only as told by your health care provider.  Drink enough fluid to keep your urine pale yellow.  Keep all follow-up visits as told by your health care provider. This is important. How is this prevented?  There is no vaccine to help prevent COVID-19 infection. However, there are steps you can take to protect yourself and others from this virus. To protect yourself:   Do not travel to areas where COVID-19 is a risk. The areas where COVID-19 is reported change often. To identify high-risk areas and travel restrictions, check the CDC travel website: FatFares.com.br  If you live in, or must travel to, an area where COVID-19 is a risk, take precautions to avoid infection. ? Stay away from people who are sick. ? Wash your hands often with soap and water for 20 seconds. If soap and water are not available, use an alcohol-based hand  sanitizer. ? Avoid touching your mouth, face, eyes, or nose. ? Avoid going out in public, follow guidance from your state and local health authorities. ? If you must go out in public, wear a cloth face covering or face mask. Make sure your mask covers your nose and mouth. ? Avoid crowded indoor spaces. Stay at least 6 feet (2 meters) away from others. ? Disinfect objects and surfaces that are frequently touched every day. This may include:  Counters and tables.  Doorknobs and  light switches.  Sinks and faucets.  Electronics, such as phones, remote controls, keyboards, computers, and tablets. To protect others: If you have symptoms of COVID-19, take steps to prevent the virus from spreading to others.  If you think you have a COVID-19 infection, contact your health care provider right away. Tell your health care team that you think you may have a COVID-19 infection.  Stay home. Leave your house only to seek medical care. Do not use public transport.  Do not travel while you are sick.  Wash your hands often with soap and water for 20 seconds. If soap and water are not available, use alcohol-based hand sanitizer.  Stay away from other members of your household. Let healthy household members care for children and pets, if possible. If you have to care for children or pets, wash your hands often and wear a mask. If possible, stay in your own room, separate from others. Use a different bathroom.  Make sure that all people in your household wash their hands well and often.  Cough or sneeze into a tissue or your sleeve or elbow. Do not cough or sneeze into your hand or into the air.  Wear a cloth face covering or face mask. Make sure your mask covers your nose and mouth. Where to find more information  Centers for Disease Control and Prevention: StickerEmporium.tn  World Health Organization: https://thompson-craig.com/ Contact a health care provider  if:  You live in or have traveled to an area where COVID-19 is a risk and you have symptoms of the infection.  You have had contact with someone who has COVID-19 and you have symptoms of the infection. Get help right away if:  You have trouble breathing.  You have pain or pressure in your chest.  You have confusion.  You have bluish lips and fingernails.  You have difficulty waking from sleep.  You have symptoms that get worse. These symptoms may represent a serious problem that is an emergency. Do not wait to see if the symptoms will go away. Get medical help right away. Call your local emergency services (911 in the [place] Do not drive yourself to the hospital. Let the emergency medical personnel know if you think you have COVID-19. Summary  COVID-19 is a respiratory infection that is caused by a virus. It is also known as coronavirus disease or novel coronavirus. It can cause serious infections, such as pneumonia, acute respiratory distress syndrome, acute respiratory failure, or sepsis.  The virus that causes COVID-19 is contagious. This means that it can spread from person to person through droplets from breathing, speaking, singing, coughing, or sneezing.  You are more likely to develop a serious illness if you are 51 years of age or older, have a weak immune system, live in a nursing home, or have chronic disease.  There is no medicine to treat COVID-19. Your health care provider will talk with you about ways to treat your symptoms.  Take steps to protect yourself and others from infection. Wash your hands often and disinfect objects and surfaces that are frequently touched every day. Stay away from people who are sick and wear a mask if you are sick. This information is not intended to replace advice given to you by your health care provider. Make sure you discuss any questions you have with your health care provider. Document Revised: 03/13/2019 Document Reviewed:  06/19/2018 Elsevier Patient Education  2020 [employer].

## 2019-06-04 NOTE — Discharge Summary (Addendum)
[facility]  Physician Discharge Summary   Patient ID: Austin [name] MRN: [id number] DOB/AGE: 12/10/1960 59 y.o.  Admit date: 05/26/2019 Discharge date: 06/04/2019  PCP: Paulina Fusi, MD  DISCHARGE DIAGNOSES:  Acute respiratory failure with hypoxia Pneumonia due to COVID-19 Chronic systolic CHF Hyperkalemia Chronic kidney disease stage IIIa Diabetes mellitus type 2 uncontrolled with hyperglycemia, new diagnosis Transaminitis secondary to COVID-19   RECOMMENDATIONS FOR OUTPATIENT FOLLOW UP: 1. Check CBC and complete metabolic panel at follow-up 2. Patient being discharged with oxygen at home 3. Needs further management and evaluation of newly diagnosed diabetes 4. Please note that his Sherryll Burger has been stopped due to persistent hyperkalemia    Home Health: None Equipment/Devices: Home oxygen  CODE STATUS: Full code  DISCHARGE CONDITION: fair  Diet recommendation: Modified carbohydrate  INITIAL HISTORY: Austin [name] is an 59 y.o. male past medical history of chronic systolic heart failure, AICD, essential hypertension lives at home, relates that he has not been feeling well for 4 days prior to admission went to [facility] ED was found to be hypoxic with improvement in his oxygenation with 15 L of high flow nasal cannula.  Chest x-ray showed bilateral infiltrate and SARS-CoV-2 PCR was positive.  He was started empirically on IV remdesivir and steroids   HOSPITAL COURSE:   Acute respiratory failure with hypoxia secondary to pneumonia due to COVID-19; Patient was requiring 10 L of oxygen during the early part of this hospitalization.  His inflammatory markers were significantly elevated.  Patient was placed on remdesivir and steroids.  He started improving slowly.  He is now down to 1 to 2 L of oxygen requiring mainly with exertion.  Home oxygen has been ordered.  Inflammatory markers improved with CRP down to 3.4.  D-dimer 1.51.  Procalcitonin 0.17.   Leukocytosis is due to steroids.  Chronic systolic heart failure/hyperkalemia Unknown EF, with an AICD in place. Suspect his EF is less than 30%.  Followed by cardiology at [facility] (Dr. Tomie China).  He is noted to be on Entresto.  Also on metoprolol.  He is on Lasix.    Due to persistent hyperkalemia his Sherryll Burger will be discontinued for now.  Patient was given Kayexalate this morning with improvement in potassium levels.  We will need to discuss this further with cardiology.    Hyperkalemia See above  Chronic kidney disease stage IIIa: Renal function at baseline.    Uncontrolled diabetes mellitus type 2 with hypoglycemia: HbA1c 7.7.  Patient not on any glucose lowering agents at home.  This appears to be a new diagnosis for him.  Patient switched over to Metformin from long-acting insulin due to hypoglycemia.    Outpatient follow-up  Transaminitis Secondary to COVID-19.  Recheck in the outpatient setting.  Dyslipidemia Continue statin therapy.  GERD: Continue PPI.  Watery stool: Now resolved.  Overall stable.  Okay for discharge home today with home oxygen.    PERTINENT LABS:  The results of significant diagnostics from this hospitalization (including imaging, microbiology, ancillary and laboratory) are listed below for reference.     Basic Metabolic Panel: Recent Labs  Lab 05/28/19 1705 05/29/19 0950 05/30/19 0410 06/03/19 1140 06/04/19 0410 06/04/19 1220  NA 138 140 139 135 133*  --   K 4.4 4.6 4.2 5.4* 5.4* 4.8  CL 107 104 100 96* 94*  --   CO2 22 23 25 28 28   --   GLUCOSE 289* 190* 46* 134* 132*  --   BUN 46* 43* 41* 32* 34*  --  CREATININE 1.58* 1.35* 1.32* 1.17 1.28*  --   CALCIUM 8.9 9.1 9.2 9.3 9.5  --   MG  --  2.3 2.6*  --   --   --    Liver Function Tests: Recent Labs  Lab 05/29/19 0950 05/30/19 0410  AST 65* 73*  ALT 51* 61*  ALKPHOS 58 60  BILITOT 0.6 0.6  PROT 7.9 8.0  ALBUMIN 2.9* 3.1*   CBC: Recent Labs  Lab 05/29/19 0950  05/30/19 0410 05/31/19 0527 06/04/19 0410  WBC 13.9* 20.8* 19.4* 14.2*  NEUTROABS 12.3* 18.0* 17.0*  --   HGB 15.5 16.3 16.1 15.5  HCT 49.0 51.8 51.2 49.5  MCV 95.1 96.3 97.2 95.4  PLT 319 371 329 268   BNP: BNP (last 3 results) Recent Labs    05/28/19 0545 05/29/19 0950 05/30/19 0410  BNP 200.5* 251.8* 139.3*    CBG: Recent Labs  Lab 06/03/19 1142 06/03/19 1558 06/03/19 2022 06/04/19 0742 06/04/19 1145  GLUCAP 148* 188* 176* 109* 140*     IMAGING STUDIES DG CHEST PORT 1 VIEW  Result Date: 05/30/2019 CLINICAL DATA:  Leukocytosis EXAM: PORTABLE CHEST 1 VIEW COMPARISON:  05/26/2019 FINDINGS: Dual lead pacer/AICD device. Midline trachea. Moderate cardiomegaly. No pleural effusion or pneumothorax. Lower lung predominant interstitial opacities are slightly improved. Slightly worse on the right than left. IMPRESSION: Slightly improved aeration with persistent basilar predominant interstitial opacities. Pulmonary edema versus viral pneumonia. Electronically Signed   By: Jeronimo Greaves M.D.   On: 05/30/2019 15:08   DG Chest Port 1 View  Result Date: 05/26/2019 CLINICAL DATA:  Dyspnea EXAM: PORTABLE CHEST 1 VIEW COMPARISON:  Chest radiograph from one day prior. FINDINGS: Two lead left subclavian ICD is stable in configuration. Stable cardiomediastinal silhouette with mild cardiomegaly. No pneumothorax. No pleural effusion. Extensive patchy parahilar and lower lung opacities bilaterally, worsened. IMPRESSION: Cardiomegaly. Worsening extensive patchy parahilar and lower lung opacities bilaterally, favor cardiogenic pulmonary edema due to heart failure given the rapid change, with pneumonia not excluded. Electronically Signed   By: Delbert Phenix M.D.   On: 05/26/2019 14:51    DISCHARGE EXAMINATION: Vitals:   06/03/19 2200 06/04/19 0844 06/04/19 1114 06/04/19 1500  BP:  (!) 115/91 (!) 139/95 (!) 141/89  Pulse: 83 100 84 94  Resp: 20 20  20   Temp: 97.9 F (36.6 C) 98 F (36.7 C)   97.9 F (36.6 C)  TempSrc: Oral Oral  Oral  SpO2: 96% 98%  93%  Weight:      Height:       General appearance: Awake alert.  In no distress Resp: Normal effort at rest.  Coarse breath sounds with a few crackles at the bases.  No wheezing or rhonchi. Cardio: S1-S2 is normal regular.  No S3-S4.  No rubs murmurs or bruit GI: Abdomen is soft.  Nontender nondistended.  Bowel sounds are present normal.  No masses organomegaly    DISPOSITION: Home  Discharge Instructions    Call MD for:  difficulty breathing, headache or visual disturbances   Complete by: As directed    Call MD for:  extreme fatigue   Complete by: As directed    Call MD for:  persistant dizziness or light-headedness   Complete by: As directed    Call MD for:  persistant nausea and vomiting   Complete by: As directed    Call MD for:  severe uncontrolled pain   Complete by: As directed    Call MD for:  temperature >100.4  Complete by: As directed    Diet Carb Modified   Complete by: As directed    Discharge instructions   Complete by: As directed    Please be sure to follow-up with your primary care physician and also with your cardiologist.  One of your heart medications has been held as your potassium level has been running high.  You will need to discuss this with your outpatient providers.  COVID 19 INSTRUCTIONS  - You are felt to be stable enough to no longer require inpatient monitoring, testing, and treatment, though you will need to follow the recommendations below: - Based on the CDC's non-test criteria for ending self-isolation: You may not return to work/leave the home until at least 21 days since symptom onset AND 3 days without a fever (without taking tylenol, ibuprofen, etc.) AND have improvement in respiratory symptoms. - Do not take NSAID medications (including, but not limited to, ibuprofen, advil, motrin, naproxen, aleve, goody's powder, etc.) - Follow up with your doctor in the next week via  telehealth or seek medical attention right away if your symptoms get WORSE.  - Consider donating plasma after you have recovered (either 14 days after a negative test or 28 days after symptoms have completely resolved) because your antibodies to this virus may be helpful to give to others with life-threatening infections. Please go to the website [email or web address] if you would like to consider volunteering for plasma donation.    Directions for you at home:  Wear a facemask You should wear a facemask that covers your nose and mouth when you are in the same room with other people and when you visit a healthcare provider. People who live with or visit you should also wear a facemask while they are in the same room with you.  Separate yourself from other people in your home As much as possible, you should stay in a different room from other people in your home. Also, you should use a separate bathroom, if available.  Avoid sharing household items You should not share dishes, drinking glasses, cups, eating utensils, towels, bedding, or other items with other people in your home. After using these items, you should wash them thoroughly with soap and water.  Cover your coughs and sneezes Cover your mouth and nose with a tissue when you cough or sneeze, or you can cough or sneeze into your sleeve. Throw used tissues in a lined trash can, and immediately wash your hands with soap and water for at least 20 seconds or use an alcohol-based hand rub.  Wash your [employer] your hands often and thoroughly with soap and water for at least 20 seconds. You can use an alcohol-based hand sanitizer if soap and water are not available and if your hands are not visibly dirty. Avoid touching your eyes, nose, and mouth with unwashed hands.  Directions for those who live with, or provide care at home for you:  Limit the number of people who have contact with the patient If possible, have only one caregiver for  the patient. Other household members should stay in another home or place of residence. If this is not possible, they should stay in another room, or be separated from the patient as much as possible. Use a separate bathroom, if available. Restrict visitors who do not have an essential need to be in the home.  Ensure good ventilation Make sure that shared spaces in the home have good air flow, such as from an air conditioner or  an opened window, weather permitting.  Wash your hands often Wash your hands often and thoroughly with soap and water for at least 20 seconds. You can use an alcohol based hand sanitizer if soap and water are not available and if your hands are not visibly dirty. Avoid touching your eyes, nose, and mouth with unwashed hands. Use disposable paper towels to dry your hands. If not available, use dedicated cloth towels and replace them when they become wet.  Wear a facemask and gloves Wear a disposable facemask at all times in the room and gloves when you touch or have contact with the patient's blood, body fluids, and/or secretions or excretions, such as sweat, saliva, sputum, nasal mucus, vomit, urine, or feces.  Ensure the mask fits over your nose and mouth tightly, and do not touch it during use. Throw out disposable facemasks and gloves after using them. Do not reuse. Wash your hands immediately after removing your facemask and gloves. If your personal clothing becomes contaminated, carefully remove clothing and launder. Wash your hands after handling contaminated clothing. Place all used disposable facemasks, gloves, and other waste in a lined container before disposing them with other household waste. Remove gloves and wash your hands immediately after handling these items.  Do not share dishes, glasses, or other household items with the patient Avoid sharing household items. You should not share dishes, drinking glasses, cups, eating utensils, towels, bedding, or  other items with a patient who is confirmed to have, or being evaluated for, COVID-19 infection. After the person uses these items, you should wash them thoroughly with soap and water.  Wash laundry thoroughly Immediately remove and wash clothes or bedding that have blood, body fluids, and/or secretions or excretions, such as sweat, saliva, sputum, nasal mucus, vomit, urine, or feces, on them. Wear gloves when handling laundry from the patient. Read and follow directions on labels of laundry or clothing items and detergent. In general, wash and dry with the warmest temperatures recommended on the label.  Clean all areas the individual has used often Clean all touchable surfaces, such as counters, tabletops, doorknobs, bathroom fixtures, toilets, phones, keyboards, tablets, and bedside tables, every day. Also, clean any surfaces that may have blood, body fluids, and/or secretions or excretions on them. Wear gloves when cleaning surfaces the patient has come in contact with. Use a diluted bleach solution (e.g., dilute bleach with 1 part bleach and 10 parts water) or a household disinfectant with a label that says EPA-registered for coronaviruses. To make a bleach solution at home, add 1 tablespoon of bleach to 1 quart (4 cups) of water. For a larger supply, add  cup of bleach to 1 gallon (16 cups) of water. Read labels of cleaning products and follow recommendations provided on product labels. Labels contain instructions for safe and effective use of the cleaning product including precautions you should take when applying the product, such as wearing gloves or eye protection and making sure you have good ventilation during use of the product. Remove gloves and wash hands immediately after cleaning.  Monitor yourself for signs and symptoms of illness Caregivers and household members are considered close contacts, should monitor their health, and will be asked to limit movement outside of the home to  the extent possible. Follow the monitoring steps for close contacts listed on the symptom monitoring form.   If you have additional questions, contact your local health department or call the epidemiologist on call at [phone] (available 24/7). This guidance is subject to  change. For the most up-to-date guidance from [facility], please refer to their website: TripMetro.hu   You were cared for by a hospitalist during your hospital stay. If you have any questions about your discharge medications or the care you received while you were in the hospital after you are discharged, you can call the unit and asked to speak with the hospitalist on call if the hospitalist that took care of you is not available. Once you are discharged, your primary care physician will handle any further medical issues. Please note that NO REFILLS for any discharge medications will be authorized once you are discharged, as it is imperative that you return to your primary care physician (or establish a relationship with a primary care physician if you do not have one) for your aftercare needs so that they can reassess your need for medications and monitor your lab values. If you do not have a primary care physician, you can call [phone] for a physician referral.   Increase activity slowly   Complete by: As directed         Allergies as of 06/04/2019   No Known Allergies     Medication List    STOP taking these medications   Entresto 24-26 MG Generic drug: sacubitril-valsartan     TAKE these medications   allopurinol 300 MG tablet Commonly known as: ZYLOPRIM Take 300 mg by mouth daily.   aspirin EC 81 MG tablet Take 1 tablet (81 mg total) by mouth daily.   dexamethasone 2 MG tablet Commonly known as: DECADRON Take 2 tablets once daily for 3 days, then 1 tablet once daily for 3 days, then STOP.   esomeprazole 40 MG capsule Commonly known as:  NEXIUM Take 40 mg by mouth daily at 12 noon.   Fluticasone-Salmeterol 100-50 MCG/DOSE Aepb Commonly known as: ADVAIR Inhale 1 puff into the lungs every 12 (twelve) hours.   furosemide 40 MG tablet Commonly known as: LASIX Take 40 mg by mouth daily.   hydrALAZINE 25 MG tablet Commonly known as: APRESOLINE Take 37.5 mg by mouth 3 (three) times daily.   metFORMIN 500 MG tablet Commonly known as: GLUCOPHAGE Take 1 tablet (500 mg total) by mouth 2 (two) times daily with a meal.   metoprolol succinate 50 MG 24 hr tablet Commonly known as: TOPROL-XL Take 1 tablet (50 mg total) by mouth daily. Take with or immediately following a meal. What changed: Another medication with the same name was removed. Continue taking this medication, and follow the directions you see here.   simvastatin 40 MG tablet Commonly known as: ZOCOR Take 40 mg by mouth daily.   Ventolin HFA 108 (90 Base) MCG/ACT inhaler Generic drug: albuterol Inhale 1-2 puffs into the lungs every 4 (four) hours as needed for wheezing or shortness of breath.   Vitamin D (Ergocalciferol) 1.25 MG (50000 UT) Caps capsule Commonly known as: DRISDOL Take 50,000 Units by mouth every 7 (seven) days.            Durable Medical Equipment  (From admission, onward)         Start     Ordered   06/03/19 1235  For home use only DME oxygen  Once    Question Answer Comment  Length of Need 6 Months   Mode or (Route) Nasal cannula   Liters per Minute 2   Frequency Continuous (stationary and portable oxygen unit needed)   Oxygen conserving device Yes   Oxygen delivery system Gas  06/03/19 1234           Follow-up Information    Paulina Fusi, MD. Schedule an appointment as soon as possible for a visit in 2 week(s).   Specialty: Internal Medicine Contact information: [address] [place] [address] [phone]        Revankar, Aundra Dubin, MD Follow up.   Specialty: Cardiology Contact  information: [address] [place] [address] [phone]           TOTAL DISCHARGE TIME: 35 minutes  Lamont Glasscock Rito Ehrlich  [facility] Pager on [email or web address]  06/04/2019, 4:29 PM

## 2019-06-04 NOTE — TOC Transition Note (Signed)
Transition of Care [facility]) - CM/SW Discharge Note   Patient Details  Name: Austin [name] MRN: [id number] Date of Birth: 1960/06/13  [facility]) CM/SW Contact:  Bess Kinds, RN Phone Number: [phone] 06/04/2019, 3:38 PM   Clinical Narrative:    Spoke with patient's fiance, Leane Para, on the phone. Discussed that patient was ready for discharge, but needed home oxygen. Verified home address. Discussed choice - referral made to [facility] - orders/notes/demographics faxed to [phone]. Verified receipt. Provided Joann with contact number to [facility] [phone] and advised to call asap when they arrive home in order to get delivery before portable tank empties. Joann verbalized understanding. Also reminded to make hospital f/u appointment with PCP, and advised that this may be a telehealth appointment. No further TOC needs identified at this time.    Final next level of care: Home/Self Care Barriers to Discharge: No Barriers Identified   Patient Goals and CMS Choice   CMS Medicare.gov Compare Post Acute Care list provided to:: Patient Represenative (must comment)(Joann Mabry) Choice offered to / list presented to : NA  Discharge Placement                       Discharge Plan and Services                DME Arranged: Oxygen DME Agency: Other - Comment([facility]) Date DME Agency Contacted: 06/04/19 Time DME Agency Contacted: 1538 Representative spoke with at DME Agency: [facility] Arranged: NA HH Agency: NA        Social Determinants of Health (SDOH) Interventions     Readmission Risk Interventions No flowsheet data found.

## 2019-06-08 ENCOUNTER — Ambulatory Visit (INDEPENDENT_AMBULATORY_CARE_PROVIDER_SITE_OTHER): Payer: Medicare Other | Admitting: *Deleted

## 2019-06-08 DIAGNOSIS — I429 Cardiomyopathy, unspecified: Secondary | ICD-10-CM | POA: Diagnosis not present

## 2019-06-08 LAB — CUP PACEART REMOTE DEVICE CHECK
Date Time Interrogation Session: 20210111113431
Implantable Lead Implant Date: 20180711
Implantable Lead Location: 753860
Implantable Lead Model: 413997
Implantable Lead Serial Number: 49848420
Implantable Pulse Generator Implant Date: 20130924
Pulse Gen Serial Number: 60669836

## 2019-06-15 ENCOUNTER — Telehealth: Payer: Self-pay | Admitting: Emergency Medicine

## 2019-06-15 NOTE — Telephone Encounter (Signed)
Patient contacted because no contact with monitor for > 21 days. Patient has been sleeping io the living room and was hospitalized for 8 days for COVID-19. Monitor to be moved to room patient is sleeping in.

## 2019-06-22 ENCOUNTER — Other Ambulatory Visit: Payer: Self-pay

## 2019-06-25 ENCOUNTER — Other Ambulatory Visit: Payer: Self-pay

## 2019-06-25 ENCOUNTER — Ambulatory Visit (INDEPENDENT_AMBULATORY_CARE_PROVIDER_SITE_OTHER): Payer: Medicare Other | Admitting: Cardiology

## 2019-06-25 ENCOUNTER — Encounter: Payer: Self-pay | Admitting: Cardiology

## 2019-06-25 VITALS — BP 160/92 | HR 104 | Ht 71.0 in | Wt 261.0 lb

## 2019-06-25 DIAGNOSIS — I429 Cardiomyopathy, unspecified: Secondary | ICD-10-CM | POA: Diagnosis not present

## 2019-06-25 DIAGNOSIS — Z95810 Presence of automatic (implantable) cardiac defibrillator: Secondary | ICD-10-CM

## 2019-06-25 DIAGNOSIS — I5022 Chronic systolic (congestive) heart failure: Secondary | ICD-10-CM

## 2019-06-25 DIAGNOSIS — I2510 Atherosclerotic heart disease of native coronary artery without angina pectoris: Secondary | ICD-10-CM

## 2019-06-25 DIAGNOSIS — N182 Chronic kidney disease, stage 2 (mild): Secondary | ICD-10-CM

## 2019-06-25 DIAGNOSIS — I129 Hypertensive chronic kidney disease with stage 1 through stage 4 chronic kidney disease, or unspecified chronic kidney disease: Secondary | ICD-10-CM

## 2019-06-25 NOTE — Patient Instructions (Addendum)
Medication Instructions:  Your physician recommends that you continue on your current medications as directed. Please refer to the Current Medication list given to you today.  *If you need a refill on your cardiac medications before your next appointment, please call your pharmacy*  Lab Work: Your physician recommends that you have a BMP drawn   If you have labs (blood work) drawn today and your tests are completely normal, you will receive your results only by: Marland Kitchen MyChart Message (if you have MyChart) OR . A paper copy in the mail If you have any lab test that is abnormal or we need to change your treatment, we will call you to review the results.  Testing/Procedures: You have been referred to the CHF clinic per your physicians request. You will be contacted to schedule this.   Follow-Up: At [facility], you and your health needs are our priority.  As part of our continuing mission to provide you with exceptional heart care, we have created designated Provider Care Teams.  These Care Teams include your primary Cardiologist (physician) and Advanced Practice Providers (APPs -  Physician Assistants and Nurse Practitioners) who all work together to provide you with the care you need, when you need it.  Your next appointment:   3 month(s)  The format for your next appointment:   In Person  Provider:   Belva Crome, MD

## 2019-06-25 NOTE — Progress Notes (Signed)
Cardiology Office Note:    Date:  06/25/2019   ID:  Austin [name], DOB [birth year], MRN [id number]  PCP:  Nicoletta Dress, MD  Cardiologist:  Jenean Lindau, MD   Referring MD: Nicoletta Dress, MD    ASSESSMENT:    1. Cardiomyopathy, unspecified type ([place])   2. Chronic systolic congestive heart failure ([place])   3. Benign hypertension with chronic kidney disease, stage II   4. Coronary artery disease involving native coronary artery of native heart without angina pectoris   5. ICD (implantable cardioverter-defibrillator), single, in situ    PLAN:    In order of problems listed above:  1. Advanced cardiomyopathy: Patient was advised about congestive heart failure and diet and appropriate measures and he vocalized understanding.  He weighs himself on a regular basis.  His ejection fraction is significantly depressed and he has a defibrillator.  We will do a Chem-7 today.  His Entresto was discontinued because of persistent hyperkalemia.  We will also refer him to the congestive heart failure for some specialist input. 2. Essential hypertension: Blood pressure is elevated.  He tells me that he is blood pressure is elevated in the doctor's office.  I would be a little worried about causing hypotension therefore I have asked him to keep a track of blood pressures at home and get in touch with me.  He will do so soon. 3. Diabetes mellitus and dyslipidemia: Managed by primary care physician.  Weight reduction was stressed.  Diet was emphasized. 4. Patient will be seen in follow-up appointment in 3 months or earlier if the patient has any concerns    Medication Adjustments/Labs and Tests Ordered: Current medicines are reviewed at length with the patient today.  Concerns regarding medicines are outlined above.  Orders Placed This Encounter  Procedures  . Basic Metabolic Panel (BMET)  . AMB referral to CHF clinic   No orders of the defined types were placed in this  encounter.    Chief Complaint  Patient presents with  . Follow-up     History of Present Illness:    Austin [name] is a 59 y.o. male.  Patient has past medical history of advanced cardiomyopathy and has a defibrillator.  He was admitted to [facility].  He is also diabetic.  He was treated for congestive heart failure and feels better.  He denies any chest pain orthopnea or PND.  He is here for follow-up appointment.  He leads a sedentary lifestyle.  At the time of my evaluation, the patient is alert awake oriented and in no distress.  Past Medical History:  Diagnosis Date  . Automatic implantable cardioverter-defibrillator in situ   . Chronic bronchitis (HCC)    Take advair inhaler  . Diabetes mellitus without complication ([place])    takes insulin  . Gout   . Hyperlipemia   . Hypertension   . Myocardial infarction ([place])   . Pneumonia 2013  . Shortness of breath    with exertion    Past Surgical History:  Procedure Laterality Date  . CARDIAC CATHETERIZATION     stent x 2  . MULTIPLE EXTRACTIONS WITH ALVEOLOPLASTY N/A 03/16/2013   Procedure: MULTIPLE EXTRACTION (#9, 10, 11, 12, 13, 14, 15, 18, 20, 21, 22, 23, 24, 25, 26, 27) WITH ALVEOLOPLASTY;  Surgeon: Gae Bon, DDS;  Location: [place];  Service: Oral Surgery;  Laterality: N/A;  . TOE SURGERY Right    pinky toe    Current Medications: Current  Meds  Medication Sig  . allopurinol (ZYLOPRIM) 300 MG tablet Take 300 mg by mouth daily.  Marland Kitchen aspirin EC 81 MG tablet Take 1 tablet (81 mg total) by mouth daily.  Marland Kitchen dexamethasone (DECADRON) 2 MG tablet Take 2 tablets once daily for 3 days, then 1 tablet once daily for 3 days, then STOP.  Marland Kitchen esomeprazole (NEXIUM) 40 MG capsule Take 40 mg by mouth daily at 12 noon.  . Fluticasone-Salmeterol (ADVAIR) 100-50 MCG/DOSE AEPB Inhale 1 puff into the lungs every 12 (twelve) hours.  . furosemide (LASIX) 40 MG tablet Take 40 mg by mouth daily.   Marland Kitchen gabapentin (NEURONTIN) 300 MG  capsule Take 300 mg by mouth 3 (three) times daily.  . hydrALAZINE (APRESOLINE) 25 MG tablet Take 37.5 mg by mouth 3 (three) times daily.  . metFORMIN (GLUCOPHAGE) 500 MG tablet Take 1 tablet (500 mg total) by mouth 2 (two) times daily with a meal.  . metoprolol succinate (TOPROL-XL) 50 MG 24 hr tablet Take 1 tablet (50 mg total) by mouth daily. Take with or immediately following a meal.  . simvastatin (ZOCOR) 40 MG tablet Take 40 mg by mouth daily.  . VENTOLIN HFA 108 (90 Base) MCG/ACT inhaler Inhale 1-2 puffs into the lungs every 4 (four) hours as needed for wheezing or shortness of breath.      Allergies:   Patient has no known allergies.   Social History   Socioeconomic History  . Marital status: Single    Spouse name: Not on file  . Number of children: Not on file  . Years of education: Not on file  . Highest education level: Not on file  Occupational History  . Occupation: unemployed  Tobacco Use  . Smoking status: Former Games developer  . Smokeless tobacco: Never Used  Substance and Sexual Activity  . Alcohol use: Yes    Alcohol/week: 2.0 standard drinks    Types: 2 Cans of beer per week    Comment: occasional  . Drug use: No  . Sexual activity: Not on file  Other Topics Concern  . Not on file  Social History Narrative  . Not on file   Social Determinants of Health   Financial Resource Strain:   . Difficulty of Paying Living Expenses: Not on file  Food Insecurity:   . Worried About Programme researcher, broadcasting/film/video in the Last Year: Not on file  . Ran Out of Food in the Last Year: Not on file  Transportation Needs:   . Lack of Transportation (Medical): Not on file  . Lack of Transportation (Non-Medical): Not on file  Physical Activity:   . Days of Exercise per Week: Not on file  . Minutes of Exercise per Session: Not on file  Stress:   . Feeling of Stress : Not on file  Social Connections:   . Frequency of Communication with Friends and Family: Not on file  . Frequency of Social  Gatherings with Friends and Family: Not on file  . Attends Religious Services: Not on file  . Active Member of Clubs or Organizations: Not on file  . Attends Banker Meetings: Not on file  . Marital Status: Not on file     Family History: The patient's family history includes Heart attack in his father; Heart disease in his mother.  ROS:   Please see the history of present illness.    All other systems reviewed and are negative.  EKGs/Labs/Other Studies Reviewed:    The following studies were reviewed  today: I reviewed hospital records extensively and discussed with patient   Recent Labs: 11/04/2018: TSH 1.780 05/30/2019: ALT 61; B Natriuretic Peptide 139.3; Magnesium 2.6 06/04/2019: BUN 34; Creatinine, Ser 1.28; Hemoglobin 15.5; Platelets 268; Potassium 4.8; Sodium 133  Recent Lipid Panel    Component Value Date/Time   CHOL 331 (H) 11/04/2018 1019   TRIG 126 11/04/2018 1019   HDL 93 11/04/2018 1019   CHOLHDL 3.6 11/04/2018 1019   LDLCALC 213 (H) 11/04/2018 1019    Physical Exam:    VS:  BP (!) 160/92   Pulse (!) 104   Ht 5\' 11"  (1.803 m)   Wt 261 lb (118.4 kg)   SpO2 95%   BMI 36.40 kg/m     Wt Readings from Last 3 Encounters:  06/25/19 261 lb (118.4 kg)  05/26/19 190 lb (86.2 kg)  03/09/19 275 lb (124.7 kg)     GEN: Patient is in no acute distress HEENT: Normal NECK: No JVD; No carotid bruits LYMPHATICS: No lymphadenopathy CARDIAC: Hear sounds regular, 2/6 systolic murmur at the apex. RESPIRATORY:  Clear to auscultation without rales, wheezing or rhonchi  ABDOMEN: Soft, non-tender, non-distended MUSCULOSKELETAL:  No edema; No deformity  SKIN: Warm and dry NEUROLOGIC:  Alert and oriented x 3 PSYCHIATRIC:  Normal affect   Signed, 05/09/19, MD  06/25/2019 2:43 PM    [facility]

## 2019-06-26 LAB — BASIC METABOLIC PANEL
BUN/Creatinine Ratio: 13 (ref 9–20)
BUN: 14 mg/dL (ref 6–24)
CO2: 23 mmol/L (ref 20–29)
Calcium: 10.1 mg/dL (ref 8.7–10.2)
Chloride: 101 mmol/L (ref 96–106)
Creatinine, Ser: 1.07 mg/dL (ref 0.76–1.27)
GFR calc Af Amer: 87 mL/min/{1.73_m2} (ref >59)
GFR calc non Af Amer: 76 mL/min/{1.73_m2} (ref >59)
Glucose: 131 mg/dL — ABNORMAL HIGH (ref 65–99)
Potassium: 5.1 mmol/L (ref 3.5–5.2)
Sodium: 141 mmol/L (ref 134–144)

## 2019-06-30 ENCOUNTER — Telehealth: Payer: Self-pay

## 2019-06-30 NOTE — Telephone Encounter (Signed)
-----   Message from Garwin Brothers, MD sent at 06/26/2019 12:47 PM EST ----- The results of the study is unremarkable. Please inform patient. I will discuss in detail at next appointment. Cc  primary care/referring physician Garwin Brothers, MD 06/26/2019 12:47 PM

## 2019-06-30 NOTE — Telephone Encounter (Signed)
Results relayed, no further questions. 

## 2019-06-30 NOTE — Telephone Encounter (Signed)
Left message for patient to call office for results. Copy sent to Dr. Tomasa Blase

## 2019-08-08 DIAGNOSIS — I10 Essential (primary) hypertension: Secondary | ICD-10-CM | POA: Diagnosis not present

## 2019-08-08 DIAGNOSIS — E782 Mixed hyperlipidemia: Secondary | ICD-10-CM | POA: Diagnosis not present

## 2019-08-08 DIAGNOSIS — J449 Chronic obstructive pulmonary disease, unspecified: Secondary | ICD-10-CM | POA: Diagnosis not present

## 2019-08-08 DIAGNOSIS — M109 Gout, unspecified: Secondary | ICD-10-CM | POA: Diagnosis not present

## 2019-08-13 DIAGNOSIS — E119 Type 2 diabetes mellitus without complications: Secondary | ICD-10-CM | POA: Diagnosis not present

## 2019-08-13 DIAGNOSIS — E1165 Type 2 diabetes mellitus with hyperglycemia: Secondary | ICD-10-CM | POA: Diagnosis not present

## 2019-08-13 DIAGNOSIS — I10 Essential (primary) hypertension: Secondary | ICD-10-CM | POA: Diagnosis not present

## 2019-08-13 DIAGNOSIS — E038 Other specified hypothyroidism: Secondary | ICD-10-CM | POA: Diagnosis not present

## 2019-08-13 DIAGNOSIS — I2510 Atherosclerotic heart disease of native coronary artery without angina pectoris: Secondary | ICD-10-CM | POA: Diagnosis not present

## 2019-08-13 DIAGNOSIS — N200 Calculus of kidney: Secondary | ICD-10-CM | POA: Diagnosis not present

## 2019-08-13 DIAGNOSIS — E782 Mixed hyperlipidemia: Secondary | ICD-10-CM | POA: Diagnosis not present

## 2019-08-13 DIAGNOSIS — R1012 Left upper quadrant pain: Secondary | ICD-10-CM | POA: Diagnosis not present

## 2019-08-13 DIAGNOSIS — D518 Other vitamin B12 deficiency anemias: Secondary | ICD-10-CM | POA: Diagnosis not present

## 2019-08-13 DIAGNOSIS — R300 Dysuria: Secondary | ICD-10-CM | POA: Diagnosis not present

## 2019-08-13 DIAGNOSIS — J449 Chronic obstructive pulmonary disease, unspecified: Secondary | ICD-10-CM | POA: Diagnosis not present

## 2019-08-13 DIAGNOSIS — E559 Vitamin D deficiency, unspecified: Secondary | ICD-10-CM | POA: Diagnosis not present

## 2019-08-14 ENCOUNTER — Encounter (HOSPITAL_COMMUNITY): Payer: Medicare Other | Admitting: Cardiology

## 2019-08-14 DIAGNOSIS — R1012 Left upper quadrant pain: Secondary | ICD-10-CM | POA: Diagnosis not present

## 2019-08-14 DIAGNOSIS — R1032 Left lower quadrant pain: Secondary | ICD-10-CM | POA: Diagnosis not present

## 2019-09-02 DIAGNOSIS — U071 COVID-19: Secondary | ICD-10-CM | POA: Diagnosis not present

## 2019-09-04 DIAGNOSIS — I10 Essential (primary) hypertension: Secondary | ICD-10-CM | POA: Diagnosis not present

## 2019-09-04 DIAGNOSIS — I2510 Atherosclerotic heart disease of native coronary artery without angina pectoris: Secondary | ICD-10-CM | POA: Diagnosis not present

## 2019-09-04 DIAGNOSIS — J449 Chronic obstructive pulmonary disease, unspecified: Secondary | ICD-10-CM | POA: Diagnosis not present

## 2019-09-04 DIAGNOSIS — E782 Mixed hyperlipidemia: Secondary | ICD-10-CM | POA: Diagnosis not present

## 2019-09-07 ENCOUNTER — Ambulatory Visit (INDEPENDENT_AMBULATORY_CARE_PROVIDER_SITE_OTHER): Payer: Medicare HMO | Admitting: *Deleted

## 2019-09-07 DIAGNOSIS — I429 Cardiomyopathy, unspecified: Secondary | ICD-10-CM | POA: Diagnosis not present

## 2019-09-07 LAB — CUP PACEART REMOTE DEVICE CHECK
Date Time Interrogation Session: 20210412061549
Implantable Lead Implant Date: 20180711
Implantable Lead Location: 753860
Implantable Lead Model: 413997
Implantable Lead Serial Number: 49848420
Implantable Pulse Generator Implant Date: 20130924
Pulse Gen Serial Number: 60669836

## 2019-09-07 NOTE — Progress Notes (Signed)
ICD Remote  

## 2019-09-15 ENCOUNTER — Encounter: Payer: Self-pay | Admitting: *Deleted

## 2019-09-15 ENCOUNTER — Telehealth: Payer: Self-pay | Admitting: Cardiology

## 2019-09-15 DIAGNOSIS — Z006 Encounter for examination for normal comparison and control in clinical research program: Secondary | ICD-10-CM

## 2019-09-15 MED ORDER — AMLODIPINE BESYLATE 5 MG PO TABS: 5.0000 mg | ORAL_TABLET | Freq: Every day | ORAL | 3 refills | Status: DC

## 2019-09-15 NOTE — Telephone Encounter (Signed)
How do you advise? C/O dizziness with standing. Pt has not messed any of his medications.  Bp has been running high off and on for 2 weeks with today's BP being the highest. 218/122 manuel HR 87 by home health nurse

## 2019-09-15 NOTE — Telephone Encounter (Signed)
  Here is the medication list verified by pt. He denies missing any medications and has taken the prn meds as well.  allopurinol (ZYLOPRIM) 300 MG tablet 300 mg, Daily  aspirin EC 81 MG tablet 81 mg, Daily   cloNIDine (CATAPRES) 0.1 MG tablet 0.1 mg, Daily PRN   dexamethasone (DECADRON) 4 MG tablet 4 mg, Daily   esomeprazole (NEXIUM) 40 MG capsule 40 mg, Daily  Fluticasone-Salmeterol (ADVAIR) 100-50 MCG/DOSE AEPB 1 puff, Every 12 hours  furosemide (LASIX) 40 MG tablet 40 mg, Daily  gabapentin (NEURONTIN) 300 MG capsule 300 mg, 3 times daily  hydrALAZINE (APRESOLINE) 50 MG tablet 50 mg, 3 times daily  metFORMIN (GLUCOPHAGE) 500 MG tablet 500 mg, 2 times daily with meals   metoprolol succinate (TOPROL-XL) 50 MG 24 hr tablet (Expired) 50 mg, Daily  simvastatin (ZOCOR) 40 MG tablet 40 mg, Daily  VENTOLIN HFA 108 (90 Base) MCG/ACT inhaler 1-2 puff, Every 4 hours PRN  Vitamin D, Ergocalciferol, (DRISDOL) 1.25 MG (50000 UNIT) CAPS capsule 50,000 Units, Weekly

## 2019-09-15 NOTE — Telephone Encounter (Signed)
Please get me a list of current medications ASAP

## 2019-09-15 NOTE — Telephone Encounter (Signed)
Pt verbalized understanding of Dr. Kem Parkinson recommendations. Pt had no additional questions.

## 2019-09-15 NOTE — Telephone Encounter (Signed)
Amlodipine 5 mg daily.  Watch salt in diet.  Keep a track of blood pressures twice a day and get back to [place].  If blood pressure increases significantly more than 180 or 190 go to the emergency room or urgent care center.

## 2019-09-15 NOTE — Addendum Note (Signed)
Addended by: Eleonore Chiquito on: 09/15/2019 04:05 PM   Modules accepted: Orders

## 2019-09-15 NOTE — Telephone Encounter (Signed)
New Message     Pt c/o BP issue: STAT if pt c/o blurred vision, one-sided weakness or slurred speech  1. What are your last 5 BP readings? 176/115 165/98 162/113   2. Are you having any other symptoms (ex. Dizziness, headache, blurred vision, passed out)? Headache, Fatigue, Dizziness   3. What is your BP issue? Austin [name] is calling and says the pt is having elevated Bp and symptoms    Please call back

## 2019-09-15 NOTE — Research (Signed)
4/20/2021Called patient to speak with him about batwire study. Patient asked if I could call him back tomorrow his home health nurse was there at this time. Will follow up tomorrow. :)   09/18/2019 called patient back to speak about BatWire he asked for me to call him Monday   09/21/2019 called patient back and spoke with him about BatWire. Patient said to send him out information about the study. Will mail out information and a ICF today.    Thanked him for taking time to listen to me.  He has an appointment with Dr Shirlee Latch on the 13 of May if I don't hear from him before then I will talk with him at appt.

## 2019-09-23 ENCOUNTER — Ambulatory Visit (INDEPENDENT_AMBULATORY_CARE_PROVIDER_SITE_OTHER): Payer: Medicare HMO | Admitting: Cardiology

## 2019-09-23 ENCOUNTER — Other Ambulatory Visit: Payer: Self-pay

## 2019-09-23 ENCOUNTER — Encounter: Payer: Self-pay | Admitting: Cardiology

## 2019-09-23 VITALS — BP 154/84 | HR 101 | Temp 97.0°F | Ht 71.0 in | Wt 274.4 lb

## 2019-09-23 DIAGNOSIS — N182 Chronic kidney disease, stage 2 (mild): Secondary | ICD-10-CM | POA: Diagnosis not present

## 2019-09-23 DIAGNOSIS — E0822 Diabetes mellitus due to underlying condition with diabetic chronic kidney disease: Secondary | ICD-10-CM | POA: Diagnosis not present

## 2019-09-23 DIAGNOSIS — Z95810 Presence of automatic (implantable) cardiac defibrillator: Secondary | ICD-10-CM | POA: Diagnosis not present

## 2019-09-23 DIAGNOSIS — I5022 Chronic systolic (congestive) heart failure: Secondary | ICD-10-CM | POA: Diagnosis not present

## 2019-09-23 DIAGNOSIS — I2510 Atherosclerotic heart disease of native coronary artery without angina pectoris: Secondary | ICD-10-CM | POA: Diagnosis not present

## 2019-09-23 DIAGNOSIS — I129 Hypertensive chronic kidney disease with stage 1 through stage 4 chronic kidney disease, or unspecified chronic kidney disease: Secondary | ICD-10-CM | POA: Diagnosis not present

## 2019-09-23 MED ORDER — SACUBITRIL-VALSARTAN 24-26 MG PO TABS: 1.0000 | ORAL_TABLET | Freq: Two times a day (BID) | ORAL | Status: DC

## 2019-09-23 NOTE — Patient Instructions (Signed)
Medication Instructions:  Your physician has recommended you make the following change in your medication:   Stop Amlodipine. Start Entresto 24-26 mg twice daily.  *If you need a refill on your cardiac medications before your next appointment, please call your pharmacy*   Lab Work: Your physician recommends that you have a BMET today in the office and again at your next visit.  If you have labs (blood work) drawn today and your tests are completely normal, you will receive your results only by: Marland Kitchen MyChart Message (if you have MyChart) OR . A paper copy in the mail If you have any lab test that is abnormal or we need to change your treatment, we will call you to review the results.   Testing/Procedures: None ordered   Follow-Up: At [facility], you and your health needs are our priority.  As part of our continuing mission to provide you with exceptional heart care, we have created designated Provider Care Teams.  These Care Teams include your primary Cardiologist (physician) and Advanced Practice Providers (APPs -  Physician Assistants and Nurse Practitioners) who all work together to provide you with the care you need, when you need it.  We recommend signing up for the patient portal called "MyChart".  Sign up information is provided on this After Visit Summary.  MyChart is used to connect with patients for Virtual Visits (Telemedicine).  Patients are able to view lab/test results, encounter notes, upcoming appointments, etc.  Non-urgent messages can be sent to your provider as well.   To learn more about what you can do with MyChart, go to [email or web address].    Your next appointment:   8 days  The format for your next appointment:   In Person  Provider:   Jyl Heinz, MD   Other Instructions Sacubitril; Valsartan Oral Tablets What is this medicine? SACUBITRIL; VALSARTAN (sak UE bi tril; val SAR tan) is a combination of a neprilysin inhibitor and a an angiotensin  II receptor blocker. It treats heart failure. This medicine may be used for other purposes; ask your health care provider or pharmacist if you have questions. COMMON BRAND NAME(S): Entresto What should I tell my health care provider before I take this medicine? They need to know if you have any of these conditions:  diabetes and take a medicine that contains aliskiren  kidney disease  liver disease  an unusual or allergic reaction to sacubitril; valsartan, drugs called angiotensin converting enzyme (ACE) inhibitors, angiotensin II receptor blockers (ARBs), other medicines, foods, dyes, or preservatives  pregnant or trying to get pregnant  breast-feeding How should I use this medicine? Take this drug by mouth. Take it as directed on the prescription label at the same time every day. You can take it with or without food. If it upsets your stomach, take it with food. Keep taking it unless your health care provider tells you to stop. Talk to your health care provider about the use of this drug in children. While it may be prescribed for children as young as 1 for selected conditions, precautions do apply. Overdosage: If you think you have taken too much of this medicine contact a poison control center or emergency room at once. NOTE: This medicine is only for you. Do not share this medicine with others. What if I miss a dose? If you miss a dose, take it as soon as you can. If it is almost time for your next dose, take only that dose. Do not take double or extra  doses. What may interact with this medicine? Do not take this medicine with any of the following medicines:  aliskiren if you have diabetes  angiotensin-converting enzyme (ACE) inhibitors, like benazepril, captopril, enalapril, fosinopril, lisinopril, or ramipril This medicine may also interact with the following medicines:  angiotensin II receptor blockers (ARBs) like azilsartan, candesartan, eprosartan, irbesartan, losartan,  olmesartan, telmisartan, or valsartan  lithium  NSAIDS, medicines for pain and inflammation, like ibuprofen or naproxen  potassium-sparing diuretics like amiloride, spironolactone, and triamterene  potassium supplements This list may not describe all possible interactions. Give your health care provider a list of all the medicines, herbs, non-prescription drugs, or dietary supplements you use. Also tell them if you smoke, drink alcohol, or use illegal drugs. Some items may interact with your medicine. What should I watch for while using this medicine? Tell your doctor or healthcare professional if your symptoms do not start to get better or if they get worse. Do not become pregnant while taking this medicine. Women should inform their doctor if they wish to become pregnant or think they might be pregnant. There is a potential for serious side effects to an unborn child. Talk to your health care professional or pharmacist for more information. You may get dizzy. Do not drive, use machinery, or do anything that needs mental alertness until you know how this medicine affects you. Do not stand or sit up quickly, especially if you are an older patient. This reduces the risk of dizzy or fainting spells. Avoid alcoholic drinks; they can make you more dizzy. What side effects may I notice from receiving this medicine? Side effects that you should report to your doctor or health care professional as soon as possible:  allergic reactions like skin rash, itching or hives, swelling of the face, lips, or tongue  signs and symptoms of increased potassium like muscle weakness; chest pain; or fast, irregular heartbeat  signs and symptoms of kidney injury like trouble passing urine or change in the amount of urine  signs and symptoms of low blood pressure like feeling dizzy or lightheaded, or if you develop extreme fatigue Side effects that usually do not require medical attention (report to your doctor or  health care professional if they continue or are bothersome):  cough This list may not describe all possible side effects. Call your doctor for medical advice about side effects. You may report side effects to FDA at 1-800-FDA-1088. Where should I keep my medicine? Keep out of the reach of children and pets. Store at room temperature between 20 and 25 degrees C (68 and 77 degrees F). Protect from moisture. Keep the container tightly closed. Throw away any unused drug after the expiration date. NOTE: This sheet is a summary. It may not cover all possible information. If you have questions about this medicine, talk to your doctor, pharmacist, or health care provider.  2020 Elsevier/Gold Standard (2018-12-17 16:03:07)

## 2019-09-23 NOTE — Addendum Note (Signed)
Addended by: Eleonore Chiquito on: 09/23/2019 02:24 PM   Modules accepted: Orders

## 2019-09-23 NOTE — Progress Notes (Signed)
Cardiology Office Note:    Date:  09/23/2019   ID:  Austin [name], DOB June 05, 1960, MRN [id number]  PCP:  Nicoletta Dress, MD  Cardiologist:  Jenean Lindau, MD   Referring MD: Nicoletta Dress, MD    ASSESSMENT:    1. Chronic systolic congestive heart failure ([place])   2. ICD (implantable cardioverter-defibrillator), single, in situ   3. Benign hypertension with chronic kidney disease, stage II   4. Coronary artery disease involving native coronary artery of native heart without angina pectoris   5. Diabetes mellitus due to underlying condition, controlled, with stage 2 chronic kidney disease, without long-term current use of insulin (HCC)    PLAN:    In order of problems listed above:  Coronary artery disease secondary prevention stressed with the patient.  Importance of compliance with diet medication stressed and he vocalized understanding. Congestive heart failure: Education was given to the patient especially diet.  He has been doing well with his salt intake.  He is lined to walk some on a regular basis.  He will have a Chem-7 done today.  I will stop amlodipine and start him on Entresto low-dose and will see him back in 8 to 10 days for follow-up.  Benefits and potential is explained and he vocalized understanding. Essential hypertension: Blood pressure stable this intervention hopefully will help his blood pressure better. Obesity diabetes mellitus and dyslipidemia: Diet was emphasized at extensive length and he promises to comply. Follow-up appointment in 8 to 10 days or earlier if he has any concerns.  At that time we will also do a Chem-7.  Patient had multiple questions which were answered to his satisfaction.  Medication Adjustments/Labs and Tests Ordered: Current medicines are reviewed at length with the patient today.  Concerns regarding medicines are outlined above.  No orders of the defined types were placed in this encounter.  No orders of the defined types  were placed in this encounter.    No chief complaint on file.    History of Present Illness:    Austin [name] is a 59 y.o. male.  Patient has past medical history of essential hypertension and advanced cardiomyopathy.  He is post defibrillator.  He has coronary artery disease.  He has history of diabetes mellitus dyslipidemia and obesity.  He mentions to me that he is doing fine overall.  No chest pain orthopnea or PND.  He was initiated on Entresto but this was discontinued in the hospital a few months ago because of hyperkalemia.  At the time of my evaluation, the patient is alert awake oriented and in no distress.  Past Medical History:  Diagnosis Date  . Acute respiratory failure with hypoxia ([place]) 05/27/2019  . AKI (acute kidney injury) ([place]) 12/17/2016  . Asthma 07/23/2018  . Automatic implantable cardioverter-defibrillator in situ   . Benign hypertension with chronic kidney disease, stage II 12/17/2016  . Cardiomyopathy ([place]) 05/01/2016  . Chronic bronchitis (HCC)    Take advair inhaler  . Chronic GERD 12/17/2016  . Chronic systolic congestive heart failure ([place]) 12/17/2016  . Controlled diabetes mellitus with stage 2 chronic kidney disease ([place]) 12/17/2016  . Coronary artery disease 07/23/2018   cardiac stent x 2  Formatting of this note might be different from the original. cardiac stent x 2  . COVID-19 virus infection 05/26/2019  . Diabetes mellitus without complication ([place])    takes insulin  . Gout   . Hyperlipemia   . Hypertension   . ICD (  implantable cardioverter-defibrillator), single, in situ 05/01/2016  . Metabolic bone disease 03/26/2017  . Myocardial infarction (HCC)   . Pneumonia 2013  . Shortness of breath    with exertion  . Vitamin D deficiency 03/26/2017    Past Surgical History:  Procedure Laterality Date  . CARDIAC CATHETERIZATION     stent x 2  . MULTIPLE EXTRACTIONS WITH ALVEOLOPLASTY N/A 03/16/2013   Procedure: MULTIPLE EXTRACTION (#9, 10, 11, 12,  13, 14, 15, 18, 20, 21, 22, 23, 24, 25, 26, 27) WITH ALVEOLOPLASTY;  Surgeon: Georgia Lopes, DDS;  Location: [facility] OR;  Service: Oral Surgery;  Laterality: N/A;  . TOE SURGERY Right    pinky toe    Current Medications: Current Meds  Medication Sig  . allopurinol (ZYLOPRIM) 300 MG tablet Take 300 mg by mouth daily.  Marland Kitchen amLODipine (NORVASC) 5 MG tablet Take 1 tablet (5 mg total) by mouth daily.  Marland Kitchen aspirin EC 81 MG tablet Take 1 tablet (81 mg total) by mouth daily.  . cloNIDine (CATAPRES) 0.1 MG tablet Take 0.1 mg by mouth daily as needed.  Marland Kitchen dexamethasone (DECADRON) 4 MG tablet Take 4 mg by mouth daily.  Marland Kitchen esomeprazole (NEXIUM) 40 MG capsule Take 40 mg by mouth daily at 12 noon.  . Fluticasone-Salmeterol (ADVAIR) 100-50 MCG/DOSE AEPB Inhale 1 puff into the lungs every 12 (twelve) hours.  . furosemide (LASIX) 40 MG tablet Take 40 mg by mouth daily.   Marland Kitchen gabapentin (NEURONTIN) 300 MG capsule Take 300 mg by mouth 3 (three) times daily.  . hydrALAZINE (APRESOLINE) 50 MG tablet Take 50 mg by mouth 3 (three) times daily.  . metFORMIN (GLUCOPHAGE) 500 MG tablet Take 1 tablet (500 mg total) by mouth 2 (two) times daily with a meal.  . oxyCODONE-acetaminophen (ROXICET) 5-325 MG/5ML solution Take 5 mLs by mouth every 6 (six) hours as needed for severe pain.  . simvastatin (ZOCOR) 40 MG tablet Take 40 mg by mouth daily.  . VENTOLIN HFA 108 (90 Base) MCG/ACT inhaler Inhale 1-2 puffs into the lungs every 4 (four) hours as needed for wheezing or shortness of breath.   . Vitamin D, Ergocalciferol, (DRISDOL) 1.25 MG (50000 UNIT) CAPS capsule Take 50,000 Units by mouth once a week.     Allergies:   Patient has no known allergies.   Social History   Socioeconomic History  . Marital status: Single    Spouse name: Not on file  . Number of children: Not on file  . Years of education: Not on file  . Highest education level: Not on file  Occupational History  . Occupation: unemployed  Tobacco Use  . Smoking  status: Former Games developer  . Smokeless tobacco: Never Used  Substance and Sexual Activity  . Alcohol use: Yes    Alcohol/week: 2.0 standard drinks    Types: 2 Cans of beer per week    Comment: occasional  . Drug use: No  . Sexual activity: Not on file  Other Topics Concern  . Not on file  Social History Narrative  . Not on file   Social Determinants of Health   Financial Resource Strain:   . Difficulty of Paying Living Expenses:   Food Insecurity:   . Worried About Programme researcher, broadcasting/film/video in the Last Year:   . Barista in the Last Year:   Transportation Needs:   . Freight forwarder (Medical):   Marland Kitchen Lack of Transportation (Non-Medical):   Physical Activity:   . Days of  Exercise per Week:   . Minutes of Exercise per Session:   Stress:   . Feeling of Stress :   Social Connections:   . Frequency of Communication with Friends and Family:   . Frequency of Social Gatherings with Friends and Family:   . Attends Religious Services:   . Active Member of Clubs or Organizations:   . Attends Banker Meetings:   Marland Kitchen Marital Status:      Family History: The patient's family history includes Heart attack in his father; Heart disease in his mother.  ROS:   Please see the history of present illness.    All other systems reviewed and are negative.  EKGs/Labs/Other Studies Reviewed:    The following studies were reviewed today: I reviewed records from [facility] admission in the past.   Recent Labs: 11/04/2018: TSH 1.780 05/30/2019: ALT 61; B Natriuretic Peptide 139.3; Magnesium 2.6 06/04/2019: Hemoglobin 15.5; Platelets 268 06/25/2019: BUN 14; Creatinine, Ser 1.07; Potassium 5.1; Sodium 141  Recent Lipid Panel    Component Value Date/Time   CHOL 331 (H) 11/04/2018 1019   TRIG 126 11/04/2018 1019   HDL 93 11/04/2018 1019   CHOLHDL 3.6 11/04/2018 1019   LDLCALC 213 (H) 11/04/2018 1019    Physical Exam:    VS:  BP (!) 154/84   Pulse (!) 101   Temp (!) 97  F (36.1 C)   Ht 5\' 11"  (1.803 m)   Wt 274 lb 6.4 oz (124.5 kg)   SpO2 100%   BMI 38.27 kg/m     Wt Readings from Last 3 Encounters:  09/23/19 274 lb 6.4 oz (124.5 kg)  06/25/19 261 lb (118.4 kg)  05/26/19 190 lb (86.2 kg)     GEN: Patient is in no acute distress HEENT: Normal NECK: No JVD; No carotid bruits LYMPHATICS: No lymphadenopathy CARDIAC: Hear sounds regular, 2/6 systolic murmur at the apex. RESPIRATORY:  Clear to auscultation without rales, wheezing or rhonchi  ABDOMEN: Soft, non-tender, non-distended MUSCULOSKELETAL:  No edema; No deformity  SKIN: Warm and dry NEUROLOGIC:  Alert and oriented x 3 PSYCHIATRIC:  Normal affect   Signed, 05/28/19, MD  09/23/2019 2:02 PM    [facility]

## 2019-09-24 ENCOUNTER — Other Ambulatory Visit: Payer: Self-pay | Admitting: Cardiology

## 2019-09-24 ENCOUNTER — Telehealth: Payer: Self-pay | Admitting: Cardiology

## 2019-09-24 LAB — BASIC METABOLIC PANEL WITH GFR
BUN/Creatinine Ratio: 31 — ABNORMAL HIGH (ref 9–20)
BUN: 40 mg/dL — ABNORMAL HIGH (ref 6–24)
CO2: 24 mmol/L (ref 20–29)
Calcium: 10.5 mg/dL — ABNORMAL HIGH (ref 8.7–10.2)
Chloride: 95 mmol/L — ABNORMAL LOW (ref 96–106)
Creatinine, Ser: 1.29 mg/dL — ABNORMAL HIGH (ref 0.76–1.27)
GFR calc Af Amer: 70 mL/min/{1.73_m2}
GFR calc non Af Amer: 60 mL/min/{1.73_m2}
Glucose: 165 mg/dL — ABNORMAL HIGH (ref 65–99)
Potassium: 4.8 mmol/L (ref 3.5–5.2)
Sodium: 140 mmol/L (ref 134–144)

## 2019-09-24 MED ORDER — SACUBITRIL-VALSARTAN 24-26 MG PO TABS: 1.0000 | ORAL_TABLET | Freq: Two times a day (BID) | ORAL | 1 refills | Status: AC

## 2019-09-24 NOTE — Telephone Encounter (Signed)
New Message   Pt c/o medication issue:  1. Name of Medication: oxyCODONE-acetaminophen (ROXICET) 5-325 MG/5ML solution   2. How are you currently taking this medication (dosage and times per day)?   3. Are you having a reaction (difficulty breathing--STAT)?   4. What is your medication issue? Patient states that at his appt yesterday that he was suppose to get a new pain medication because this one is not working. Please advise.

## 2019-09-24 NOTE — Telephone Encounter (Signed)
Called and spoke with Mr. Austin [name] and explained that he needs to gets his RX for his pain medication and steroids form his PCP. Pt verbalized understanding and states that he will contact them.

## 2019-09-24 NOTE — Telephone Encounter (Signed)
Pt aware that he needs to contact his PCP for this refill.

## 2019-09-24 NOTE — Addendum Note (Signed)
Addended by: Eleonore Chiquito on: 09/24/2019 10:06 AM   Modules accepted: Orders

## 2019-09-24 NOTE — Telephone Encounter (Signed)
New Message    *STAT* If patient is at the pharmacy, call can be transferred to refill team.   1. Which medications need to be refilled? (please list name of each medication and dose if known) dexamethasone (DECADRON) 4 MG tablet, sacubitril-valsartan (ENTRESTO) 24-26 mg per tablet       2. Which pharmacy/location (including street and city if local pharmacy) is medication to be sent to?[id number] - [place], [place] - [address]  3. Do they need a 30 day or 90 day supply? 90 day supply

## 2019-09-25 ENCOUNTER — Telehealth: Payer: Self-pay | Admitting: *Deleted

## 2019-09-25 DIAGNOSIS — M5416 Radiculopathy, lumbar region: Secondary | ICD-10-CM | POA: Diagnosis not present

## 2019-09-25 DIAGNOSIS — M5440 Lumbago with sciatica, unspecified side: Secondary | ICD-10-CM | POA: Diagnosis not present

## 2019-09-25 DIAGNOSIS — M545 Low back pain: Secondary | ICD-10-CM | POA: Diagnosis not present

## 2019-09-25 NOTE — Telephone Encounter (Signed)
Called patient to see if he received the information in the mail about [employer]. He said he hadn't checked the mail yet and would prob check it today. He asked for me to call back him Monday.

## 2019-09-29 DIAGNOSIS — I951 Orthostatic hypotension: Secondary | ICD-10-CM | POA: Diagnosis not present

## 2019-09-29 DIAGNOSIS — E7800 Pure hypercholesterolemia, unspecified: Secondary | ICD-10-CM | POA: Diagnosis not present

## 2019-09-29 DIAGNOSIS — R42 Dizziness and giddiness: Secondary | ICD-10-CM | POA: Diagnosis not present

## 2019-09-29 DIAGNOSIS — E119 Type 2 diabetes mellitus without complications: Secondary | ICD-10-CM | POA: Diagnosis not present

## 2019-09-29 DIAGNOSIS — I10 Essential (primary) hypertension: Secondary | ICD-10-CM | POA: Diagnosis not present

## 2019-09-29 DIAGNOSIS — I2510 Atherosclerotic heart disease of native coronary artery without angina pectoris: Secondary | ICD-10-CM | POA: Diagnosis not present

## 2019-09-29 DIAGNOSIS — Z955 Presence of coronary angioplasty implant and graft: Secondary | ICD-10-CM | POA: Diagnosis not present

## 2019-09-29 DIAGNOSIS — R918 Other nonspecific abnormal finding of lung field: Secondary | ICD-10-CM | POA: Diagnosis not present

## 2019-10-01 ENCOUNTER — Encounter: Payer: Self-pay | Admitting: Cardiology

## 2019-10-01 ENCOUNTER — Ambulatory Visit (INDEPENDENT_AMBULATORY_CARE_PROVIDER_SITE_OTHER): Payer: Medicare HMO | Admitting: Cardiology

## 2019-10-01 ENCOUNTER — Other Ambulatory Visit: Payer: Self-pay

## 2019-10-01 VITALS — BP 130/82 | HR 98 | Ht 71.0 in | Wt 275.0 lb

## 2019-10-01 DIAGNOSIS — N182 Chronic kidney disease, stage 2 (mild): Secondary | ICD-10-CM

## 2019-10-01 DIAGNOSIS — I2510 Atherosclerotic heart disease of native coronary artery without angina pectoris: Secondary | ICD-10-CM

## 2019-10-01 DIAGNOSIS — I429 Cardiomyopathy, unspecified: Secondary | ICD-10-CM | POA: Diagnosis not present

## 2019-10-01 DIAGNOSIS — I129 Hypertensive chronic kidney disease with stage 1 through stage 4 chronic kidney disease, or unspecified chronic kidney disease: Secondary | ICD-10-CM

## 2019-10-01 DIAGNOSIS — Z95810 Presence of automatic (implantable) cardiac defibrillator: Secondary | ICD-10-CM | POA: Diagnosis not present

## 2019-10-01 DIAGNOSIS — I5022 Chronic systolic (congestive) heart failure: Secondary | ICD-10-CM

## 2019-10-01 NOTE — Progress Notes (Signed)
Cardiology Office Note:    Date:  10/01/2019   ID:  Austin [name], DOB [birth year], MRN [id number]  PCP:  Austin Fusi, MD  Cardiologist:  Austin Brothers, MD   Referring MD: Austin Fusi, MD    ASSESSMENT:    1. Cardiomyopathy, unspecified type (HCC)   2. Chronic systolic congestive heart failure (HCC)   3. Benign hypertension with chronic kidney disease, stage II   4. Coronary artery disease involving native coronary artery of native heart without angina pectoris   5. ICD (implantable cardioverter-defibrillator), single, in situ    PLAN:    In order of problems listed above:  1. Primary prevention stressed with patient.  Importance of compliance with diet medication stressed and he vocalized understanding. 2. Cardiomyopathy and congestive heart failure: Stable at this time.  There is no evidence of any worsening of congestive heart failure.  Patient takes care of activities of daily living.  He has an appointment with the partners in the congestive heart failure clinic in [place] coming up and he is aware of it. 3. Essential hypertension: Blood pressure stable.  Diet issues including salt intake issues were discussed with him at length. 4. Renal insufficiency: Stable at this time I reviewed [name] from [facility].  I educated him about this. 5. He will be seen in follow-up appointment in a month or earlier if he has any concerns.  Weight reduction was stressed and diet was discussed for diabetes also.  Questions were answered to his satisfaction.   Medication Adjustments/Labs and Tests Ordered: Current medicines are reviewed at length with the patient today.  Concerns regarding medicines are outlined above.  No orders of the defined types were placed in this encounter.  No orders of the defined types were placed in this encounter.    No chief complaint on file.    History of Present Illness:    Austin [name] is a 59 y.o. male.  Patient has past  medical history of advanced cardiomyopathy, essential hypertension renal insufficiency and has a defibrillator in place.  He denies any problems at this time.  He mentions to me that he had an episode of dizziness when he stood up before he went to the emergency room.  He was treated and released.  Subsequently is done fine.  I reviewed emergency room [name] extensively.  His dexamethasone was discontinued.  I am not clear why he was on dexamethasone.  At the time of my evaluation, the patient is alert awake oriented and in no distress.  Past Medical History:  Diagnosis Date  . Acute respiratory failure with hypoxia (HCC) 05/27/2019  . AKI (acute kidney injury) (HCC) 12/17/2016  . Asthma 07/23/2018  . Automatic implantable cardioverter-defibrillator in situ   . Benign hypertension with chronic kidney disease, stage II 12/17/2016  . Cardiomyopathy (HCC) 05/01/2016  . Chronic bronchitis (HCC)    Take advair inhaler  . Chronic GERD 12/17/2016  . Chronic systolic congestive heart failure (HCC) 12/17/2016  . Controlled diabetes mellitus with stage 2 chronic kidney disease (HCC) 12/17/2016  . Coronary artery disease 07/23/2018   cardiac stent x 2  Formatting of this note might be different from the original. cardiac stent x 2  . COVID-19 virus infection 05/26/2019  . Diabetes mellitus without complication (HCC)    takes insulin  . Gout   . Hyperlipemia   . Hypertension   . ICD (implantable cardioverter-defibrillator), single, in situ 05/01/2016  . Metabolic bone disease 03/26/2017  .  Myocardial infarction (HCC)   . Pneumonia 2013  . Shortness of breath    with exertion  . Vitamin D deficiency 03/26/2017    Past Surgical History:  Procedure Laterality Date  . CARDIAC CATHETERIZATION     stent x 2  . MULTIPLE EXTRACTIONS WITH ALVEOLOPLASTY N/A 03/16/2013   Procedure: MULTIPLE EXTRACTION (#9, 10, 11, 12, 13, 14, 15, 18, 20, 21, 22, 23, 24, 25, 26, 27) WITH ALVEOLOPLASTY;  Surgeon: Georgia Lopes,  DDS;  Location: [facility] OR;  Service: Oral Surgery;  Laterality: N/A;  . TOE SURGERY Right    pinky toe    Current Medications: Current Meds  Medication Sig  . allopurinol (ZYLOPRIM) 300 MG tablet Take 300 mg by mouth daily.  Marland Kitchen aspirin EC 81 MG tablet Take 1 tablet (81 mg total) by mouth daily.  . cloNIDine (CATAPRES) 0.1 MG tablet Take 0.1 mg by mouth daily as needed.  Marland Kitchen esomeprazole (NEXIUM) 40 MG capsule Take 40 mg by mouth daily at 12 noon.  . Fluticasone-Salmeterol (ADVAIR) 100-50 MCG/DOSE AEPB Inhale 1 puff into the lungs every 12 (twelve) hours.  . furosemide (LASIX) 40 MG tablet Take 40 mg by mouth daily.   Marland Kitchen gabapentin (NEURONTIN) 300 MG capsule Take 300 mg by mouth 3 (three) times daily.  . hydrALAZINE (APRESOLINE) 50 MG tablet Take 50 mg by mouth 3 (three) times daily.  . metFORMIN (GLUCOPHAGE) 500 MG tablet Take 1 tablet (500 mg total) by mouth 2 (two) times daily with a meal.  . metoprolol succinate (TOPROL-XL) 50 MG 24 hr tablet Take 1 tablet (50 mg total) by mouth daily. Take with or immediately following a meal.  . oxyCODONE-acetaminophen (ROXICET) 5-325 MG/5ML solution Take 5 mLs by mouth every 6 (six) hours as needed for severe pain.  . sacubitril-valsartan (ENTRESTO) 24-26 MG Take 1 tablet by mouth 2 (two) times daily.  . simvastatin (ZOCOR) 40 MG tablet Take 40 mg by mouth daily.  . VENTOLIN HFA 108 (90 Base) MCG/ACT inhaler Inhale 1-2 puffs into the lungs every 4 (four) hours as needed for wheezing or shortness of breath.   . Vitamin D, Ergocalciferol, (DRISDOL) 1.25 MG (50000 UNIT) CAPS capsule Take 50,000 Units by mouth once a week.     Allergies:   Patient has no known allergies.   Social History   Socioeconomic History  . Marital status: Single    Spouse name: Not on file  . Number of children: Not on file  . Years of education: Not on file  . Highest education level: Not on file  Occupational History  . Occupation: unemployed  Tobacco Use  . Smoking status:  Former Games developer  . Smokeless tobacco: Never Used  Substance and Sexual Activity  . Alcohol use: Yes    Alcohol/week: 2.0 standard drinks    Types: 2 Cans of beer per week    Comment: occasional  . Drug use: No  . Sexual activity: Not on file  Other Topics Concern  . Not on file  Social History Narrative  . Not on file   Social Determinants of Health   Financial Resource Strain:   . Difficulty of Paying Living Expenses:   Food Insecurity:   . Worried About Programme researcher, broadcasting/film/video in the Last Year:   . Barista in the Last Year:   Transportation Needs:   . Freight forwarder (Medical):   Marland Kitchen Lack of Transportation (Non-Medical):   Physical Activity:   . Days of Exercise per  Week:   . Minutes of Exercise per Session:   Stress:   . Feeling of Stress :   Social Connections:   . Frequency of Communication with Friends and Family:   . Frequency of Social Gatherings with Friends and Family:   . Attends Religious Services:   . Active Member of Clubs or Organizations:   . Attends Archivist Meetings:   Marland Kitchen Marital Status:      Family History: The patient's family history includes Heart attack in his father; Heart disease in his mother.  ROS:   Please see the history of present illness.    All other systems reviewed and are negative.  EKGs/Labs/Other Studies Reviewed:    The following studies were reviewed today: I reviewed [facility] Austin [name] extensively.   Recent Labs: 11/04/2018: TSH 1.780 05/30/2019: ALT 61; B Natriuretic Peptide 139.3; Magnesium 2.6 06/04/2019: Hemoglobin 15.5; Platelets 268 09/23/2019: BUN 40; Creatinine, Ser 1.29; Potassium 4.8; Sodium 140  Recent Lipid Panel    Component Value Date/Time   CHOL 331 (H) 11/04/2018 1019   TRIG 126 11/04/2018 1019   HDL 93 11/04/2018 1019   CHOLHDL 3.6 11/04/2018 1019   LDLCALC 213 (H) 11/04/2018 1019    Physical Exam:    VS:  BP 130/82   Pulse 98   Ht 5\' 11"  (1.803 m)   Wt 275 lb (124.7 kg)    SpO2 99%   BMI 38.35 kg/m     Wt Readings from Last 3 Encounters:  10/01/19 275 lb (124.7 kg)  09/23/19 274 lb 6.4 oz (124.5 kg)  06/25/19 261 lb (118.4 kg)     GEN: Patient is in no acute distress HEENT: Normal NECK: No JVD; No carotid bruits LYMPHATICS: No lymphadenopathy CARDIAC: Hear sounds regular, 2/6 systolic murmur at the apex. RESPIRATORY:  Clear to auscultation without rales, wheezing or rhonchi  ABDOMEN: Soft, non-tender, non-distended MUSCULOSKELETAL:  No edema; No deformity  SKIN: Warm and dry NEUROLOGIC:  Alert and oriented x 3 PSYCHIATRIC:  Normal affect   Signed, Jenean Lindau, MD  10/01/2019 1:29 PM    [facility]

## 2019-10-01 NOTE — Patient Instructions (Signed)
Medication Instructions:  No medication changes. *If you need a refill on your cardiac medications before your next appointment, please call your pharmacy*   Lab Work: None ordered If you have labs (blood work) drawn today and your tests are completely normal, you will receive your results only by: . MyChart Message (if you have MyChart) OR . A paper copy in the mail If you have any lab test that is abnormal or we need to change your treatment, we will call you to review the results.   Testing/Procedures: None ordered   Follow-Up: At [facility], you and your health needs are our priority.  As part of our continuing mission to provide you with exceptional heart care, we have created designated Provider Care Teams.  These Care Teams include your primary Cardiologist (physician) and Advanced Practice Providers (APPs -  Physician Assistants and Nurse Practitioners) who all work together to provide you with the care you need, when you need it.  We recommend signing up for the patient portal called "MyChart".  Sign up information is provided on this After Visit Summary.  MyChart is used to connect with patients for Virtual Visits (Telemedicine).  Patients are able to view lab/test results, encounter notes, upcoming appointments, etc.  Non-urgent messages can be sent to your provider as well.   To learn more about what you can do with MyChart, go to [email or web address].    Your next appointment:   1 month(s)  The format for your next appointment:   In Person  Provider:   Rajan Revankar, MD   Other Instructions NA  

## 2019-10-02 DIAGNOSIS — U071 COVID-19: Secondary | ICD-10-CM | POA: Diagnosis not present

## 2019-10-06 DIAGNOSIS — G8929 Other chronic pain: Secondary | ICD-10-CM | POA: Insufficient documentation

## 2019-10-06 DIAGNOSIS — Z0189 Encounter for other specified special examinations: Secondary | ICD-10-CM | POA: Diagnosis not present

## 2019-10-06 DIAGNOSIS — M5442 Lumbago with sciatica, left side: Secondary | ICD-10-CM | POA: Diagnosis not present

## 2019-10-06 DIAGNOSIS — M5441 Lumbago with sciatica, right side: Secondary | ICD-10-CM | POA: Diagnosis not present

## 2019-10-06 HISTORY — DX: Other chronic pain: G89.29

## 2019-10-06 HISTORY — DX: Lumbago with sciatica, left side: M54.42

## 2019-10-08 ENCOUNTER — Encounter (HOSPITAL_COMMUNITY): Payer: Medicare HMO | Admitting: Cardiology

## 2019-10-19 DIAGNOSIS — E782 Mixed hyperlipidemia: Secondary | ICD-10-CM | POA: Diagnosis not present

## 2019-10-19 DIAGNOSIS — E559 Vitamin D deficiency, unspecified: Secondary | ICD-10-CM | POA: Diagnosis not present

## 2019-10-19 DIAGNOSIS — I10 Essential (primary) hypertension: Secondary | ICD-10-CM | POA: Diagnosis not present

## 2019-10-19 DIAGNOSIS — E119 Type 2 diabetes mellitus without complications: Secondary | ICD-10-CM | POA: Diagnosis not present

## 2019-10-19 DIAGNOSIS — M1712 Unilateral primary osteoarthritis, left knee: Secondary | ICD-10-CM | POA: Diagnosis not present

## 2019-10-19 DIAGNOSIS — D518 Other vitamin B12 deficiency anemias: Secondary | ICD-10-CM | POA: Diagnosis not present

## 2019-10-19 DIAGNOSIS — E038 Other specified hypothyroidism: Secondary | ICD-10-CM | POA: Diagnosis not present

## 2019-10-20 DIAGNOSIS — R2231 Localized swelling, mass and lump, right upper limb: Secondary | ICD-10-CM | POA: Diagnosis not present

## 2019-10-21 DIAGNOSIS — R0781 Pleurodynia: Secondary | ICD-10-CM | POA: Diagnosis not present

## 2019-10-21 DIAGNOSIS — W010XXA Fall on same level from slipping, tripping and stumbling without subsequent striking against object, initial encounter: Secondary | ICD-10-CM | POA: Diagnosis not present

## 2019-10-21 DIAGNOSIS — S20211A Contusion of right front wall of thorax, initial encounter: Secondary | ICD-10-CM | POA: Diagnosis not present

## 2019-10-21 DIAGNOSIS — S299XXA Unspecified injury of thorax, initial encounter: Secondary | ICD-10-CM | POA: Diagnosis not present

## 2019-10-22 NOTE — Telephone Encounter (Signed)
Will see patient when he comes to see Dr Shirlee Latch in office   Patient no show for Dr Shirlee Latch.

## 2019-11-02 DIAGNOSIS — U071 COVID-19: Secondary | ICD-10-CM | POA: Diagnosis not present

## 2019-11-03 ENCOUNTER — Ambulatory Visit: Payer: Medicare HMO | Admitting: Cardiology

## 2019-11-16 ENCOUNTER — Telehealth: Payer: Self-pay | Admitting: Emergency Medicine

## 2019-11-16 NOTE — Telephone Encounter (Signed)
Patient notified no connection with [employer] monitor for over 21 days. Patient has been sleeping in another room. Monitor moved to room patient sleeps in.

## 2019-12-02 DIAGNOSIS — U071 COVID-19: Secondary | ICD-10-CM | POA: Diagnosis not present

## 2019-12-07 ENCOUNTER — Ambulatory Visit (INDEPENDENT_AMBULATORY_CARE_PROVIDER_SITE_OTHER): Payer: Medicare HMO | Admitting: *Deleted

## 2019-12-07 DIAGNOSIS — I429 Cardiomyopathy, unspecified: Secondary | ICD-10-CM | POA: Diagnosis not present

## 2019-12-07 LAB — CUP PACEART REMOTE DEVICE CHECK
Date Time Interrogation Session: 20210712074445
Implantable Lead Implant Date: 20180711
Implantable Lead Location: 753860
Implantable Lead Model: 413997
Implantable Lead Serial Number: 49848420
Implantable Pulse Generator Implant Date: 20130924
Pulse Gen Serial Number: 60669836

## 2019-12-08 NOTE — Progress Notes (Signed)
Remote ICD transmission.   

## 2019-12-17 DIAGNOSIS — J018 Other acute sinusitis: Secondary | ICD-10-CM | POA: Diagnosis not present

## 2020-01-02 DIAGNOSIS — U071 COVID-19: Secondary | ICD-10-CM | POA: Diagnosis not present

## 2020-02-02 DIAGNOSIS — U071 COVID-19: Secondary | ICD-10-CM | POA: Diagnosis not present

## 2020-02-10 DIAGNOSIS — E785 Hyperlipidemia, unspecified: Secondary | ICD-10-CM | POA: Diagnosis not present

## 2020-02-10 DIAGNOSIS — G8929 Other chronic pain: Secondary | ICD-10-CM | POA: Diagnosis not present

## 2020-02-10 DIAGNOSIS — I110 Hypertensive heart disease with heart failure: Secondary | ICD-10-CM | POA: Diagnosis not present

## 2020-02-10 DIAGNOSIS — I4891 Unspecified atrial fibrillation: Secondary | ICD-10-CM | POA: Diagnosis not present

## 2020-02-10 DIAGNOSIS — J449 Chronic obstructive pulmonary disease, unspecified: Secondary | ICD-10-CM | POA: Diagnosis not present

## 2020-02-10 DIAGNOSIS — E119 Type 2 diabetes mellitus without complications: Secondary | ICD-10-CM | POA: Diagnosis not present

## 2020-02-10 DIAGNOSIS — I509 Heart failure, unspecified: Secondary | ICD-10-CM | POA: Diagnosis not present

## 2020-02-10 DIAGNOSIS — D6869 Other thrombophilia: Secondary | ICD-10-CM | POA: Diagnosis not present

## 2020-02-10 DIAGNOSIS — I2510 Atherosclerotic heart disease of native coronary artery without angina pectoris: Secondary | ICD-10-CM | POA: Diagnosis not present

## 2020-03-03 DIAGNOSIS — U071 COVID-19: Secondary | ICD-10-CM | POA: Diagnosis not present

## 2020-03-07 ENCOUNTER — Ambulatory Visit (INDEPENDENT_AMBULATORY_CARE_PROVIDER_SITE_OTHER): Payer: Medicare HMO

## 2020-03-07 DIAGNOSIS — I5022 Chronic systolic (congestive) heart failure: Secondary | ICD-10-CM

## 2020-03-07 DIAGNOSIS — I429 Cardiomyopathy, unspecified: Secondary | ICD-10-CM | POA: Diagnosis not present

## 2020-03-08 LAB — CUP PACEART REMOTE DEVICE CHECK
Date Time Interrogation Session: 20211011052316
Implantable Lead Implant Date: 20180711
Implantable Lead Location: 753860
Implantable Lead Model: 413997
Implantable Lead Serial Number: 49848420
Implantable Pulse Generator Implant Date: 20130924
Pulse Gen Serial Number: 60669836

## 2020-03-08 NOTE — Progress Notes (Signed)
Remote ICD transmission.   

## 2020-03-16 DIAGNOSIS — R1012 Left upper quadrant pain: Secondary | ICD-10-CM | POA: Diagnosis not present

## 2020-03-16 DIAGNOSIS — R1032 Left lower quadrant pain: Secondary | ICD-10-CM | POA: Diagnosis not present

## 2020-03-16 DIAGNOSIS — K760 Fatty (change of) liver, not elsewhere classified: Secondary | ICD-10-CM | POA: Diagnosis not present

## 2020-03-16 DIAGNOSIS — R109 Unspecified abdominal pain: Secondary | ICD-10-CM | POA: Diagnosis not present

## 2020-03-17 DIAGNOSIS — K760 Fatty (change of) liver, not elsewhere classified: Secondary | ICD-10-CM | POA: Diagnosis not present

## 2020-03-17 DIAGNOSIS — R109 Unspecified abdominal pain: Secondary | ICD-10-CM | POA: Diagnosis not present

## 2020-04-03 DIAGNOSIS — U071 COVID-19: Secondary | ICD-10-CM | POA: Diagnosis not present

## 2020-05-03 DIAGNOSIS — U071 COVID-19: Secondary | ICD-10-CM | POA: Diagnosis not present

## 2020-06-03 DIAGNOSIS — I509 Heart failure, unspecified: Secondary | ICD-10-CM | POA: Diagnosis not present

## 2020-06-03 DIAGNOSIS — J449 Chronic obstructive pulmonary disease, unspecified: Secondary | ICD-10-CM | POA: Diagnosis not present

## 2020-06-06 ENCOUNTER — Ambulatory Visit (INDEPENDENT_AMBULATORY_CARE_PROVIDER_SITE_OTHER): Payer: Medicare HMO

## 2020-06-06 DIAGNOSIS — I429 Cardiomyopathy, unspecified: Secondary | ICD-10-CM | POA: Diagnosis not present

## 2020-06-06 LAB — CUP PACEART REMOTE DEVICE CHECK
Date Time Interrogation Session: 20220110091654
Implantable Lead Implant Date: 20180711
Implantable Lead Location: 753860
Implantable Lead Model: 413997
Implantable Lead Serial Number: 49848420
Implantable Pulse Generator Implant Date: 20130924
Pulse Gen Serial Number: 60669836

## 2020-06-07 DIAGNOSIS — D518 Other vitamin B12 deficiency anemias: Secondary | ICD-10-CM | POA: Diagnosis not present

## 2020-06-07 DIAGNOSIS — I2510 Atherosclerotic heart disease of native coronary artery without angina pectoris: Secondary | ICD-10-CM | POA: Diagnosis not present

## 2020-06-07 DIAGNOSIS — I10 Essential (primary) hypertension: Secondary | ICD-10-CM | POA: Diagnosis not present

## 2020-06-07 DIAGNOSIS — E782 Mixed hyperlipidemia: Secondary | ICD-10-CM | POA: Diagnosis not present

## 2020-06-07 DIAGNOSIS — E038 Other specified hypothyroidism: Secondary | ICD-10-CM | POA: Diagnosis not present

## 2020-06-07 DIAGNOSIS — Z23 Encounter for immunization: Secondary | ICD-10-CM | POA: Diagnosis not present

## 2020-06-07 DIAGNOSIS — J449 Chronic obstructive pulmonary disease, unspecified: Secondary | ICD-10-CM | POA: Diagnosis not present

## 2020-06-07 DIAGNOSIS — E119 Type 2 diabetes mellitus without complications: Secondary | ICD-10-CM | POA: Diagnosis not present

## 2020-06-07 DIAGNOSIS — E559 Vitamin D deficiency, unspecified: Secondary | ICD-10-CM | POA: Diagnosis not present

## 2020-06-07 DIAGNOSIS — M109 Gout, unspecified: Secondary | ICD-10-CM | POA: Diagnosis not present

## 2020-06-17 NOTE — Progress Notes (Signed)
Remote ICD transmission.   

## 2020-06-20 DIAGNOSIS — J410 Simple chronic bronchitis: Secondary | ICD-10-CM | POA: Diagnosis not present

## 2020-06-20 DIAGNOSIS — J449 Chronic obstructive pulmonary disease, unspecified: Secondary | ICD-10-CM | POA: Diagnosis not present

## 2020-06-20 DIAGNOSIS — E119 Type 2 diabetes mellitus without complications: Secondary | ICD-10-CM | POA: Diagnosis not present

## 2020-06-20 DIAGNOSIS — I2510 Atherosclerotic heart disease of native coronary artery without angina pectoris: Secondary | ICD-10-CM | POA: Diagnosis not present

## 2020-06-20 DIAGNOSIS — E1165 Type 2 diabetes mellitus with hyperglycemia: Secondary | ICD-10-CM | POA: Diagnosis not present

## 2020-06-20 DIAGNOSIS — E559 Vitamin D deficiency, unspecified: Secondary | ICD-10-CM | POA: Diagnosis not present

## 2020-06-20 DIAGNOSIS — D518 Other vitamin B12 deficiency anemias: Secondary | ICD-10-CM | POA: Diagnosis not present

## 2020-06-20 DIAGNOSIS — E782 Mixed hyperlipidemia: Secondary | ICD-10-CM | POA: Diagnosis not present

## 2020-06-20 DIAGNOSIS — E038 Other specified hypothyroidism: Secondary | ICD-10-CM | POA: Diagnosis not present

## 2020-06-20 DIAGNOSIS — I10 Essential (primary) hypertension: Secondary | ICD-10-CM | POA: Diagnosis not present

## 2020-07-04 DIAGNOSIS — I509 Heart failure, unspecified: Secondary | ICD-10-CM | POA: Diagnosis not present

## 2020-07-04 DIAGNOSIS — J449 Chronic obstructive pulmonary disease, unspecified: Secondary | ICD-10-CM | POA: Diagnosis not present

## 2020-07-19 DIAGNOSIS — I10 Essential (primary) hypertension: Secondary | ICD-10-CM | POA: Diagnosis not present

## 2020-07-19 DIAGNOSIS — J410 Simple chronic bronchitis: Secondary | ICD-10-CM | POA: Diagnosis not present

## 2020-07-19 DIAGNOSIS — E1165 Type 2 diabetes mellitus with hyperglycemia: Secondary | ICD-10-CM | POA: Diagnosis not present

## 2020-07-19 DIAGNOSIS — I2510 Atherosclerotic heart disease of native coronary artery without angina pectoris: Secondary | ICD-10-CM | POA: Diagnosis not present

## 2020-07-19 DIAGNOSIS — E038 Other specified hypothyroidism: Secondary | ICD-10-CM | POA: Diagnosis not present

## 2020-07-19 DIAGNOSIS — E782 Mixed hyperlipidemia: Secondary | ICD-10-CM | POA: Diagnosis not present

## 2020-07-19 DIAGNOSIS — E119 Type 2 diabetes mellitus without complications: Secondary | ICD-10-CM | POA: Diagnosis not present

## 2020-07-19 DIAGNOSIS — J449 Chronic obstructive pulmonary disease, unspecified: Secondary | ICD-10-CM | POA: Diagnosis not present

## 2020-07-19 DIAGNOSIS — D518 Other vitamin B12 deficiency anemias: Secondary | ICD-10-CM | POA: Diagnosis not present

## 2020-07-19 DIAGNOSIS — E559 Vitamin D deficiency, unspecified: Secondary | ICD-10-CM | POA: Diagnosis not present

## 2020-08-01 DIAGNOSIS — J449 Chronic obstructive pulmonary disease, unspecified: Secondary | ICD-10-CM | POA: Diagnosis not present

## 2020-08-01 DIAGNOSIS — I509 Heart failure, unspecified: Secondary | ICD-10-CM | POA: Diagnosis not present

## 2020-08-16 DIAGNOSIS — I10 Essential (primary) hypertension: Secondary | ICD-10-CM | POA: Diagnosis not present

## 2020-08-16 DIAGNOSIS — E782 Mixed hyperlipidemia: Secondary | ICD-10-CM | POA: Diagnosis not present

## 2020-08-16 DIAGNOSIS — D518 Other vitamin B12 deficiency anemias: Secondary | ICD-10-CM | POA: Diagnosis not present

## 2020-08-16 DIAGNOSIS — E559 Vitamin D deficiency, unspecified: Secondary | ICD-10-CM | POA: Diagnosis not present

## 2020-08-16 DIAGNOSIS — J449 Chronic obstructive pulmonary disease, unspecified: Secondary | ICD-10-CM | POA: Diagnosis not present

## 2020-08-16 DIAGNOSIS — J410 Simple chronic bronchitis: Secondary | ICD-10-CM | POA: Diagnosis not present

## 2020-08-16 DIAGNOSIS — E119 Type 2 diabetes mellitus without complications: Secondary | ICD-10-CM | POA: Diagnosis not present

## 2020-08-16 DIAGNOSIS — E038 Other specified hypothyroidism: Secondary | ICD-10-CM | POA: Diagnosis not present

## 2020-08-16 DIAGNOSIS — E1165 Type 2 diabetes mellitus with hyperglycemia: Secondary | ICD-10-CM | POA: Diagnosis not present

## 2020-08-16 DIAGNOSIS — I2510 Atherosclerotic heart disease of native coronary artery without angina pectoris: Secondary | ICD-10-CM | POA: Diagnosis not present

## 2020-08-18 DIAGNOSIS — J302 Other seasonal allergic rhinitis: Secondary | ICD-10-CM | POA: Diagnosis not present

## 2020-08-18 DIAGNOSIS — E119 Type 2 diabetes mellitus without complications: Secondary | ICD-10-CM | POA: Diagnosis not present

## 2020-08-18 DIAGNOSIS — E1165 Type 2 diabetes mellitus with hyperglycemia: Secondary | ICD-10-CM | POA: Diagnosis not present

## 2020-08-18 DIAGNOSIS — J018 Other acute sinusitis: Secondary | ICD-10-CM | POA: Diagnosis not present

## 2020-08-18 DIAGNOSIS — I10 Essential (primary) hypertension: Secondary | ICD-10-CM | POA: Diagnosis not present

## 2020-08-18 DIAGNOSIS — J449 Chronic obstructive pulmonary disease, unspecified: Secondary | ICD-10-CM | POA: Diagnosis not present

## 2020-09-01 DIAGNOSIS — I509 Heart failure, unspecified: Secondary | ICD-10-CM | POA: Diagnosis not present

## 2020-09-01 DIAGNOSIS — J449 Chronic obstructive pulmonary disease, unspecified: Secondary | ICD-10-CM | POA: Diagnosis not present

## 2020-09-05 ENCOUNTER — Ambulatory Visit (INDEPENDENT_AMBULATORY_CARE_PROVIDER_SITE_OTHER): Payer: Medicare HMO

## 2020-09-05 DIAGNOSIS — I429 Cardiomyopathy, unspecified: Secondary | ICD-10-CM

## 2020-09-06 LAB — CUP PACEART REMOTE DEVICE CHECK
Date Time Interrogation Session: 20220412063558
Implantable Lead Implant Date: 20180711
Implantable Lead Location: 753860
Implantable Lead Model: 413997
Implantable Lead Serial Number: 49848420
Implantable Pulse Generator Implant Date: 20130924
Pulse Gen Serial Number: 60669836

## 2020-09-07 DIAGNOSIS — M1712 Unilateral primary osteoarthritis, left knee: Secondary | ICD-10-CM | POA: Diagnosis not present

## 2020-09-07 DIAGNOSIS — E038 Other specified hypothyroidism: Secondary | ICD-10-CM | POA: Diagnosis not present

## 2020-09-07 DIAGNOSIS — I10 Essential (primary) hypertension: Secondary | ICD-10-CM | POA: Diagnosis not present

## 2020-09-07 DIAGNOSIS — E119 Type 2 diabetes mellitus without complications: Secondary | ICD-10-CM | POA: Diagnosis not present

## 2020-09-07 DIAGNOSIS — E559 Vitamin D deficiency, unspecified: Secondary | ICD-10-CM | POA: Diagnosis not present

## 2020-09-07 DIAGNOSIS — E782 Mixed hyperlipidemia: Secondary | ICD-10-CM | POA: Diagnosis not present

## 2020-09-07 DIAGNOSIS — D518 Other vitamin B12 deficiency anemias: Secondary | ICD-10-CM | POA: Diagnosis not present

## 2020-09-07 DIAGNOSIS — J449 Chronic obstructive pulmonary disease, unspecified: Secondary | ICD-10-CM | POA: Diagnosis not present

## 2020-09-07 DIAGNOSIS — M5440 Lumbago with sciatica, unspecified side: Secondary | ICD-10-CM | POA: Diagnosis not present

## 2020-09-07 DIAGNOSIS — I509 Heart failure, unspecified: Secondary | ICD-10-CM | POA: Diagnosis not present

## 2020-09-13 DIAGNOSIS — E559 Vitamin D deficiency, unspecified: Secondary | ICD-10-CM | POA: Diagnosis not present

## 2020-09-13 DIAGNOSIS — E119 Type 2 diabetes mellitus without complications: Secondary | ICD-10-CM | POA: Diagnosis not present

## 2020-09-13 DIAGNOSIS — E1165 Type 2 diabetes mellitus with hyperglycemia: Secondary | ICD-10-CM | POA: Diagnosis not present

## 2020-09-13 DIAGNOSIS — E038 Other specified hypothyroidism: Secondary | ICD-10-CM | POA: Diagnosis not present

## 2020-09-13 DIAGNOSIS — E782 Mixed hyperlipidemia: Secondary | ICD-10-CM | POA: Diagnosis not present

## 2020-09-13 DIAGNOSIS — J449 Chronic obstructive pulmonary disease, unspecified: Secondary | ICD-10-CM | POA: Diagnosis not present

## 2020-09-13 DIAGNOSIS — J410 Simple chronic bronchitis: Secondary | ICD-10-CM | POA: Diagnosis not present

## 2020-09-13 DIAGNOSIS — I10 Essential (primary) hypertension: Secondary | ICD-10-CM | POA: Diagnosis not present

## 2020-09-13 DIAGNOSIS — I2510 Atherosclerotic heart disease of native coronary artery without angina pectoris: Secondary | ICD-10-CM | POA: Diagnosis not present

## 2020-09-13 DIAGNOSIS — D518 Other vitamin B12 deficiency anemias: Secondary | ICD-10-CM | POA: Diagnosis not present

## 2020-09-16 NOTE — Progress Notes (Signed)
Remote ICD transmission.   

## 2020-09-22 DIAGNOSIS — M545 Low back pain, unspecified: Secondary | ICD-10-CM | POA: Diagnosis not present

## 2020-10-01 DIAGNOSIS — J449 Chronic obstructive pulmonary disease, unspecified: Secondary | ICD-10-CM | POA: Diagnosis not present

## 2020-10-01 DIAGNOSIS — I509 Heart failure, unspecified: Secondary | ICD-10-CM | POA: Diagnosis not present

## 2020-10-06 ENCOUNTER — Other Ambulatory Visit: Payer: Self-pay

## 2020-10-06 DIAGNOSIS — E291 Testicular hypofunction: Secondary | ICD-10-CM | POA: Insufficient documentation

## 2020-10-06 DIAGNOSIS — K601 Chronic anal fissure: Secondary | ICD-10-CM

## 2020-10-06 DIAGNOSIS — E039 Hypothyroidism, unspecified: Secondary | ICD-10-CM | POA: Insufficient documentation

## 2020-10-06 DIAGNOSIS — Z950 Presence of cardiac pacemaker: Secondary | ICD-10-CM

## 2020-10-06 DIAGNOSIS — J302 Other seasonal allergic rhinitis: Secondary | ICD-10-CM

## 2020-10-06 DIAGNOSIS — E782 Mixed hyperlipidemia: Secondary | ICD-10-CM

## 2020-10-06 DIAGNOSIS — M1A00X0 Idiopathic chronic gout, unspecified site, without tophus (tophi): Secondary | ICD-10-CM

## 2020-10-06 DIAGNOSIS — I110 Hypertensive heart disease with heart failure: Secondary | ICD-10-CM

## 2020-10-06 DIAGNOSIS — J449 Chronic obstructive pulmonary disease, unspecified: Secondary | ICD-10-CM

## 2020-10-06 DIAGNOSIS — Z95810 Presence of automatic (implantable) cardiac defibrillator: Secondary | ICD-10-CM | POA: Insufficient documentation

## 2020-10-06 DIAGNOSIS — R1012 Left upper quadrant pain: Secondary | ICD-10-CM

## 2020-10-06 DIAGNOSIS — M5430 Sciatica, unspecified side: Secondary | ICD-10-CM

## 2020-10-06 DIAGNOSIS — R2232 Localized swelling, mass and lump, left upper limb: Secondary | ICD-10-CM

## 2020-10-06 DIAGNOSIS — I10 Essential (primary) hypertension: Secondary | ICD-10-CM | POA: Insufficient documentation

## 2020-10-06 DIAGNOSIS — M179 Osteoarthritis of knee, unspecified: Secondary | ICD-10-CM

## 2020-10-06 DIAGNOSIS — E1165 Type 2 diabetes mellitus with hyperglycemia: Secondary | ICD-10-CM

## 2020-10-06 DIAGNOSIS — M1710 Unilateral primary osteoarthritis, unspecified knee: Secondary | ICD-10-CM | POA: Insufficient documentation

## 2020-10-06 DIAGNOSIS — R0989 Other specified symptoms and signs involving the circulatory and respiratory systems: Secondary | ICD-10-CM

## 2020-10-06 DIAGNOSIS — I2510 Atherosclerotic heart disease of native coronary artery without angina pectoris: Secondary | ICD-10-CM

## 2020-10-06 DIAGNOSIS — I219 Acute myocardial infarction, unspecified: Secondary | ICD-10-CM | POA: Insufficient documentation

## 2020-10-06 DIAGNOSIS — E119 Type 2 diabetes mellitus without complications: Secondary | ICD-10-CM

## 2020-10-06 DIAGNOSIS — E785 Hyperlipidemia, unspecified: Secondary | ICD-10-CM | POA: Insufficient documentation

## 2020-10-06 DIAGNOSIS — M1A9XX0 Chronic gout, unspecified, without tophus (tophi): Secondary | ICD-10-CM

## 2020-10-06 DIAGNOSIS — I509 Heart failure, unspecified: Secondary | ICD-10-CM | POA: Insufficient documentation

## 2020-10-06 DIAGNOSIS — D518 Other vitamin B12 deficiency anemias: Secondary | ICD-10-CM

## 2020-10-06 DIAGNOSIS — J410 Simple chronic bronchitis: Secondary | ICD-10-CM | POA: Insufficient documentation

## 2020-10-06 HISTORY — DX: Hypertensive heart disease with heart failure: I11.0

## 2020-10-06 HISTORY — DX: Other specified symptoms and signs involving the circulatory and respiratory systems: R09.89

## 2020-10-06 HISTORY — DX: Idiopathic chronic gout, unspecified site, without tophus (tophi): M1A.00X0

## 2020-10-06 HISTORY — DX: Testicular hypofunction: E29.1

## 2020-10-06 HISTORY — DX: Left upper quadrant pain: R10.12

## 2020-10-06 HISTORY — DX: Other vitamin B12 deficiency anemias: D51.8

## 2020-10-06 HISTORY — DX: Chronic gout, unspecified, without tophus (tophi): M1A.9XX0

## 2020-10-06 HISTORY — DX: Mixed hyperlipidemia: E78.2

## 2020-10-06 HISTORY — DX: Type 2 diabetes mellitus without complications: E11.9

## 2020-10-06 HISTORY — DX: Atherosclerotic heart disease of native coronary artery without angina pectoris: I25.10

## 2020-10-06 HISTORY — DX: Heart failure, unspecified: I50.9

## 2020-10-06 HISTORY — DX: Type 2 diabetes mellitus with hyperglycemia: E11.65

## 2020-10-06 HISTORY — DX: Unilateral primary osteoarthritis, unspecified knee: M17.10

## 2020-10-06 HISTORY — DX: Hypothyroidism, unspecified: E03.9

## 2020-10-06 HISTORY — DX: Presence of cardiac pacemaker: Z95.0

## 2020-10-06 HISTORY — DX: Localized swelling, mass and lump, left upper limb: R22.32

## 2020-10-06 HISTORY — DX: Sciatica, unspecified side: M54.30

## 2020-10-06 HISTORY — DX: Essential (primary) hypertension: I10

## 2020-10-06 HISTORY — DX: Chronic anal fissure: K60.1

## 2020-10-06 HISTORY — DX: Osteoarthritis of knee, unspecified: M17.9

## 2020-10-06 HISTORY — DX: Chronic obstructive pulmonary disease, unspecified: J44.9

## 2020-10-06 HISTORY — DX: Other seasonal allergic rhinitis: J30.2

## 2020-10-10 DIAGNOSIS — J449 Chronic obstructive pulmonary disease, unspecified: Secondary | ICD-10-CM | POA: Diagnosis not present

## 2020-10-10 DIAGNOSIS — M1712 Unilateral primary osteoarthritis, left knee: Secondary | ICD-10-CM | POA: Diagnosis not present

## 2020-10-10 DIAGNOSIS — I509 Heart failure, unspecified: Secondary | ICD-10-CM | POA: Diagnosis not present

## 2020-10-10 DIAGNOSIS — M5440 Lumbago with sciatica, unspecified side: Secondary | ICD-10-CM | POA: Diagnosis not present

## 2020-10-13 ENCOUNTER — Ambulatory Visit (INDEPENDENT_AMBULATORY_CARE_PROVIDER_SITE_OTHER): Payer: Medicare HMO | Admitting: Cardiology

## 2020-10-13 ENCOUNTER — Other Ambulatory Visit: Payer: Self-pay

## 2020-10-13 ENCOUNTER — Encounter: Payer: Self-pay | Admitting: Cardiology

## 2020-10-13 VITALS — BP 146/82 | HR 99 | Ht 71.0 in | Wt 318.4 lb

## 2020-10-13 DIAGNOSIS — Z95810 Presence of automatic (implantable) cardiac defibrillator: Secondary | ICD-10-CM | POA: Diagnosis not present

## 2020-10-13 DIAGNOSIS — I10 Essential (primary) hypertension: Secondary | ICD-10-CM

## 2020-10-13 DIAGNOSIS — E782 Mixed hyperlipidemia: Secondary | ICD-10-CM | POA: Diagnosis not present

## 2020-10-13 DIAGNOSIS — I429 Cardiomyopathy, unspecified: Secondary | ICD-10-CM | POA: Diagnosis not present

## 2020-10-13 DIAGNOSIS — I2510 Atherosclerotic heart disease of native coronary artery without angina pectoris: Secondary | ICD-10-CM | POA: Diagnosis not present

## 2020-10-13 DIAGNOSIS — E0822 Diabetes mellitus due to underlying condition with diabetic chronic kidney disease: Secondary | ICD-10-CM | POA: Diagnosis not present

## 2020-10-13 DIAGNOSIS — N182 Chronic kidney disease, stage 2 (mild): Secondary | ICD-10-CM

## 2020-10-13 NOTE — Progress Notes (Signed)
Cardiology Office Note:    Date:  10/13/2020   ID:  Austin [name], DOB January 15, 1961, MRN [id number]  PCP:  Paulina Fusi, MD  Cardiologist:  Garwin Brothers, MD   Referring MD: Paulina Fusi, MD    ASSESSMENT:    1. Essential hypertension   2. Mixed hyperlipidemia   3. Coronary artery disease involving native coronary artery of native heart without angina pectoris   4. Cardiomyopathy, unspecified type (HCC)   5. Automatic implantable cardioverter-defibrillator in situ   6. Diabetes mellitus due to underlying condition, controlled, with stage 2 chronic kidney disease, without long-term current use of insulin (HCC)    PLAN:    In order of problems listed above:  1. Coronary artery disease: Secondary prevention stressed with the patient.  Importance of compliance with diet medication stressed any vocalized understanding.  He was advised to walk on a regular basis. 2. Advanced cardiomyopathy and congestive heart failure: Echocardiogram done in 2020 reveals ejection fraction of about 20%.  I will repeat his echocardiogram to assess left ventricular systolic function.  He is agreeable.  Congestive heart failure education was given diet and regular weight checks advised and he promises to comply.  He has mild pedal edema I told him to use extra diuretic as needed on a as needed basis.  Salt intake issues and dietary issues were discussed with him at length. 3. Essential hypertension: Blood pressure stable and diet was emphasized.  He keeps a track of his blood pressures and tells me that they are better at home. 4. He has had marked dyslipidemia and therefore I want him to come back tomorrow morning for fasting blood work.  We will do complete blood work including lipids 5. Patient will be seen in follow-up appointment in 2 months or earlier if the patient has any concerns    Medication Adjustments/Labs and Tests Ordered: Current medicines are reviewed at length with the patient  today.  Concerns regarding medicines are outlined above.  No orders of the defined types were placed in this encounter.  No orders of the defined types were placed in this encounter.    No chief complaint on file.    History of Present Illness:    Austin [name] is a 60 y.o. male.  Patient has past medical history of advanced cardiomyopathy, essential hypertension, dyslipidemia, coronary artery disease and has a defibrillator.  He denies any problems at this time and takes care of activities of daily living.  He leads a sedentary lifestyle overall.  No chest pain orthopnea or PND.  At the time of my evaluation, the patient is alert awake oriented and in no distress.  Past Medical History:  Diagnosis Date  . Acute respiratory failure with hypoxia (HCC) 05/27/2019  . AKI (acute kidney injury) (HCC) 12/17/2016  . Asthma 07/23/2018  . Atherosclerotic heart disease of native coronary artery without angina pectoris 10/06/2020  . Automatic implantable cardioverter-defibrillator in situ   . Benign hypertension with chronic kidney disease, stage II 12/17/2016  . Cardiac pacemaker in situ 10/06/2020  . Cardiomyopathy (HCC) 05/01/2016  . Cardiovascular symptoms 10/06/2020  . 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 Chronic bilateral low back pain with bilateral sciatica 10/06/2019   Last Assessment & Plan:  Formatting of this note might be different from the original. 60 year old male with chronic low back and bilateral leg pain that appears to correspond to L4 distribution.  Today we will request recently completed plain films of the lumbar spine for  review.  Today recommend increasing gabapentin to 400 mg t.i.d., and will provide him with new prescription for this.  He may   . Chronic bronchitis (HCC)    Take advair inhaler  . Chronic GERD 12/17/2016  . Chronic gouty arthritis 10/06/2020  . Chronic obstructive pulmonary disease (HCC) 10/06/2020  . Chronic systolic congestive heart failure (HCC) 12/17/2016  .  Controlled diabetes mellitus with stage 2 chronic kidney disease (HCC) 12/17/2016  . Coronary artery disease 07/23/2018   cardiac stent x 2  Formatting of this note might be different from the original. cardiac stent x 2  . COVID-19 virus infection 05/26/2019  . Diabetes mellitus without complication (HCC)    takes insulin  . Essential hypertension 10/06/2020  . Gout   . Heart failure (HCC) 10/06/2020  . Hyperglycemia due to type 2 diabetes mellitus (HCC) 10/06/2020  . Hyperlipemia   . Hypertension   . Hypothyroidism 10/06/2020  . ICD (implantable cardioverter-defibrillator), single, in situ 05/01/2016  . Left upper quadrant pain 10/06/2020  . Localized swelling, mass and lump, left upper limb 10/06/2020  . Metabolic bone disease 03/26/2017  . Mixed hyperlipidemia 10/06/2020  . Myocardial infarction (HCC)   . Osteoarthritis of knee 10/06/2020  . Other vitamin B12 deficiency anemias 10/06/2020  . Pneumonia 2013  . Sciatica 10/06/2020  . Seasonal allergic rhinitis 10/06/2020  . Shortness of breath    with exertion  . Simple chronic bronchitis (HCC)    Take advair inhaler  . Testicular hypofunction 10/06/2020  . Type 2 diabetes mellitus without complications (HCC) 10/06/2020  . Vitamin D deficiency 03/26/2017    Past Surgical History:  Procedure Laterality Date  . CARDIAC CATHETERIZATION     stent x 2  . MULTIPLE EXTRACTIONS WITH ALVEOLOPLASTY N/A 03/16/2013   Procedure: MULTIPLE EXTRACTION (#9, 10, 11, 12, 13, 14, 15, 18, 20, 21, 22, 23, 24, 25, 26, 27) WITH ALVEOLOPLASTY;  Surgeon: Georgia Lopes, DDS;  Location: [facility] OR;  Service: Oral Surgery;  Laterality: N/A;  . TOE SURGERY Right    pinky toe    Current Medications: Current Meds  Medication Sig  . fluticasone (FLONASE) 50 MCG/ACT nasal spray Place 1 spray into both nostrils daily.     Allergies:   Patient has no known allergies.   Social History   Socioeconomic History  . Marital status: Single    Spouse name: Not on file  .  Number of children: Not on file  . Years of education: Not on file  . Highest education level: Not on file  Occupational History  . Occupation: unemployed  Tobacco Use  . Smoking status: Former Games developer  . Smokeless tobacco: Never Used  Vaping Use  . Vaping Use: Unknown  Substance and Sexual Activity  . Alcohol use: Yes    Alcohol/week: 2.0 standard drinks    Types: 2 Cans of beer per week    Comment: occasional  . Drug use: No  . Sexual activity: Not on file  Other Topics Concern  . Not on file  Social History Narrative  . Not on file   Social Determinants of Health   Financial Resource Strain: Not on file  Food Insecurity: Not on file  Transportation Needs: Not on file  Physical Activity: Not on file  Stress: Not on file  Social Connections: Not on file     Family History: The patient's family history includes Heart attack in his father; Heart disease in his mother.  ROS:   Please see the history  of present illness.    All other systems reviewed and are negative.  EKGs/Labs/Other Studies Reviewed:    The following studies were reviewed today: EKG reveals sinus rhythm and nonspecific ST-T changes   Recent Labs: No results found for requested labs within last 8760 hours.  Recent Lipid Panel    Component Value Date/Time   CHOL 331 (H) 11/04/2018 1019   TRIG 126 11/04/2018 1019   HDL 93 11/04/2018 1019   CHOLHDL 3.6 11/04/2018 1019   LDLCALC 213 (H) 11/04/2018 1019    Physical Exam:    VS:  BP (!) 146/82   Pulse 99   Ht 5\' 11"  (1.803 m)   Wt (!) 318 lb 6.4 oz (144.4 kg)   SpO2 93%   BMI 44.41 kg/m     Wt Readings from Last 3 Encounters:  10/13/20 (!) 318 lb 6.4 oz (144.4 kg)  10/01/19 275 lb (124.7 kg)  09/23/19 274 lb 6.4 oz (124.5 kg)     GEN: Patient is in no acute distress HEENT: Normal NECK: No JVD; No carotid bruits LYMPHATICS: No lymphadenopathy CARDIAC: Hear sounds regular, 2/6 systolic murmur at the apex. RESPIRATORY:  Clear to  auscultation without rales, wheezing or rhonchi  ABDOMEN: Soft, non-tender, non-distended MUSCULOSKELETAL:  No edema; No deformity  SKIN: Warm and dry NEUROLOGIC:  Alert and oriented x 3 PSYCHIATRIC:  Normal affect   Signed, 09/25/19, MD  10/13/2020 10:48 AM    [facility]

## 2020-10-13 NOTE — Patient Instructions (Signed)
Medication Instructions:  No medication changes. *If you need a refill on your cardiac medications before your next appointment, please call your pharmacy*   Lab Work: Your physician recommends that you return for lab work in: the next few days. You need to have labs done when you are fasting.  You can come Monday through Friday 8:30 am to 12:00 pm and 1:15 to 4:30. You do not need to make an appointment as the order has already been placed. The labs you are going to have done are BMET, CBC, TSH, LFT and Lipids.  If you have labs (blood work) drawn today and your tests are completely normal, you will receive your results only by: Marland Kitchen MyChart Message (if you have MyChart) OR . A paper copy in the mail If you have any lab test that is abnormal or we need to change your treatment, we will call you to review the results.   Testing/Procedures: Your physician has requested that you have an echocardiogram. Echocardiography is a painless test that uses sound waves to create images of your heart. It provides your doctor with information about the size and shape of your heart and how well your heart's chambers and valves are working. This procedure takes approximately one hour. There are no restrictions for this procedure.    Follow-Up: At [facility], you and your health needs are our priority.  As part of our continuing mission to provide you with exceptional heart care, we have created designated Provider Care Teams.  These Care Teams include your primary Cardiologist (physician) and Advanced Practice Providers (APPs -  Physician Assistants and Nurse Practitioners) who all work together to provide you with the care you need, when you need it.  We recommend signing up for the patient portal called "MyChart".  Sign up information is provided on this After Visit Summary.  MyChart is used to connect with patients for Virtual Visits (Telemedicine).  Patients are able to view lab/test results, encounter  notes, upcoming appointments, etc.  Non-urgent messages can be sent to your provider as well.   To learn more about what you can do with MyChart, go to [email or web address].    Your next appointment:   2 month(s)  The format for your next appointment:   In Person  Provider:   Belva Crome, MD   Other Instructions Echocardiogram An echocardiogram is a test that uses sound waves (ultrasound) to produce images of the heart. Images from an echocardiogram can provide important information about:  Heart size and shape.  The size and thickness and movement of your heart's walls.  Heart muscle function and strength.  Heart valve function or if you have stenosis. Stenosis is when the heart valves are too narrow.  If blood is flowing backward through the heart valves (regurgitation).  A tumor or infectious growth around the heart valves.  Areas of heart muscle that are not working well because of poor blood flow or injury from a heart attack.  Aneurysm detection. An aneurysm is a weak or damaged part of an artery wall. The wall bulges out from the normal force of blood pumping through the body. Tell a health care provider about:  Any allergies you have.  All medicines you are taking, including vitamins, herbs, eye drops, creams, and over-the-counter medicines.  Any blood disorders you have.  Any surgeries you have had.  Any medical conditions you have.  Whether you are pregnant or may be pregnant. What are the risks? Generally, this is a safe  test. However, problems may occur, including an allergic reaction to dye (contrast) that may be used during the test. What happens before the test? No specific preparation is needed. You may eat and drink normally. What happens during the test?  You will take off your clothes from the waist up and put on a hospital gown.  Electrodes or electrocardiogram (ECG)patches may be placed on your chest. The electrodes or patches are then  connected to a device that monitors your heart rate and rhythm.  You will lie down on a table for an ultrasound exam. A gel will be applied to your chest to help sound waves pass through your skin.  A handheld device, called a transducer, will be pressed against your chest and moved over your heart. The transducer produces sound waves that travel to your heart and bounce back (or "echo" back) to the transducer. These sound waves will be captured in real-time and changed into images of your heart that can be viewed on a video monitor. The images will be recorded on a computer and reviewed by your health care provider.  You may be asked to change positions or hold your breath for a short time. This makes it easier to get different views or better views of your heart.  In some cases, you may receive contrast through an IV in one of your veins. This can improve the quality of the pictures from your heart. The procedure may vary among health care providers and hospitals.   What can I expect after the test? You may return to your normal, everyday life, including diet, activities, and medicines, unless your health care provider tells you not to do that. Follow these instructions at home:  It is up to you to get the results of your test. Ask your health care provider, or the department that is doing the test, when your results will be ready.  Keep all follow-up visits. This is important. Summary  An echocardiogram is a test that uses sound waves (ultrasound) to produce images of the heart.  Images from an echocardiogram can provide important information about the size and shape of your heart, heart muscle function, heart valve function, and other possible heart problems.  You do not need to do anything to prepare before this test. You may eat and drink normally.  After the echocardiogram is completed, you may return to your normal, everyday life, unless your health care provider tells you not to do  that. This information is not intended to replace advice given to you by your health care provider. Make sure you discuss any questions you have with your health care provider. Document Revised: 01/05/2020 Document Reviewed: 01/05/2020 Elsevier Patient Education  2021 [employer].

## 2020-10-14 DIAGNOSIS — I2510 Atherosclerotic heart disease of native coronary artery without angina pectoris: Secondary | ICD-10-CM | POA: Diagnosis not present

## 2020-10-14 DIAGNOSIS — E0822 Diabetes mellitus due to underlying condition with diabetic chronic kidney disease: Secondary | ICD-10-CM | POA: Diagnosis not present

## 2020-10-14 DIAGNOSIS — E782 Mixed hyperlipidemia: Secondary | ICD-10-CM | POA: Diagnosis not present

## 2020-10-14 DIAGNOSIS — I10 Essential (primary) hypertension: Secondary | ICD-10-CM | POA: Diagnosis not present

## 2020-10-14 DIAGNOSIS — N182 Chronic kidney disease, stage 2 (mild): Secondary | ICD-10-CM | POA: Diagnosis not present

## 2020-10-14 LAB — CBC WITH DIFFERENTIAL/PLATELET
Basophils Absolute: 0.1 10*3/uL (ref 0.0–0.2)
Basos: 1 %
EOS (ABSOLUTE): 0.3 10*3/uL (ref 0.0–0.4)
Eos: 5 %
Hematocrit: 38.1 % (ref 37.5–51.0)
Hemoglobin: 12.8 g/dL — ABNORMAL LOW (ref 13.0–17.7)
Immature Grans (Abs): 0 10*3/uL (ref 0.0–0.1)
Immature Granulocytes: 0 %
Lymphocytes Absolute: 2.3 10*3/uL (ref 0.7–3.1)
Lymphs: 36 %
MCH: 30 pg (ref 26.6–33.0)
MCHC: 33.6 g/dL (ref 31.5–35.7)
MCV: 89 fL (ref 79–97)
Monocytes Absolute: 0.5 10*3/uL (ref 0.1–0.9)
Monocytes: 8 %
Neutrophils Absolute: 3.2 10*3/uL (ref 1.4–7.0)
Neutrophils: 50 %
Platelets: 213 10*3/uL (ref 150–450)
RBC: 4.26 x10E6/uL (ref 4.14–5.80)
RDW: 13 % (ref 11.6–15.4)
WBC: 6.3 10*3/uL (ref 3.4–10.8)

## 2020-10-14 LAB — HEPATIC FUNCTION PANEL
ALT: 30 IU/L (ref 0–44)
AST: 28 IU/L (ref 0–40)
Albumin: 4.6 g/dL (ref 3.8–4.9)
Alkaline Phosphatase: 84 IU/L (ref 44–121)
Bilirubin Total: <0.2 mg/dL (ref 0.0–1.2)
Bilirubin, Direct: <0.1 mg/dL (ref 0.00–0.40)
Total Protein: 7.2 g/dL (ref 6.0–8.5)

## 2020-10-14 LAB — BASIC METABOLIC PANEL
BUN/Creatinine Ratio: 17 (ref 10–24)
BUN: 21 mg/dL (ref 8–27)
CO2: 23 mmol/L (ref 20–29)
Calcium: 10.1 mg/dL (ref 8.6–10.2)
Chloride: 104 mmol/L (ref 96–106)
Creatinine, Ser: 1.25 mg/dL (ref 0.76–1.27)
Glucose: 125 mg/dL — ABNORMAL HIGH (ref 65–99)
Potassium: 4.4 mmol/L (ref 3.5–5.2)
Sodium: 142 mmol/L (ref 134–144)
eGFR: 66 mL/min/{1.73_m2} (ref >59)

## 2020-10-14 LAB — TSH: TSH: 3.68 u[IU]/mL (ref 0.450–4.500)

## 2020-10-14 LAB — LIPID PANEL
Chol/HDL Ratio: 2.6 ratio (ref 0.0–5.0)
Cholesterol, Total: 205 mg/dL — ABNORMAL HIGH (ref 100–199)
HDL: 79 mg/dL (ref >39)
LDL Chol Calc (NIH): 92 mg/dL (ref 0–99)
Triglycerides: 208 mg/dL — ABNORMAL HIGH (ref 0–149)
VLDL Cholesterol Cal: 34 mg/dL (ref 5–40)

## 2020-10-17 ENCOUNTER — Ambulatory Visit (INDEPENDENT_AMBULATORY_CARE_PROVIDER_SITE_OTHER): Payer: Medicare HMO

## 2020-10-17 ENCOUNTER — Other Ambulatory Visit: Payer: Self-pay

## 2020-10-17 DIAGNOSIS — I429 Cardiomyopathy, unspecified: Secondary | ICD-10-CM

## 2020-10-17 DIAGNOSIS — I2510 Atherosclerotic heart disease of native coronary artery without angina pectoris: Secondary | ICD-10-CM

## 2020-10-17 DIAGNOSIS — E119 Type 2 diabetes mellitus without complications: Secondary | ICD-10-CM | POA: Diagnosis not present

## 2020-10-17 DIAGNOSIS — E038 Other specified hypothyroidism: Secondary | ICD-10-CM | POA: Diagnosis not present

## 2020-10-17 DIAGNOSIS — E1165 Type 2 diabetes mellitus with hyperglycemia: Secondary | ICD-10-CM | POA: Diagnosis not present

## 2020-10-17 DIAGNOSIS — J410 Simple chronic bronchitis: Secondary | ICD-10-CM | POA: Diagnosis not present

## 2020-10-17 DIAGNOSIS — J449 Chronic obstructive pulmonary disease, unspecified: Secondary | ICD-10-CM | POA: Diagnosis not present

## 2020-10-17 DIAGNOSIS — D518 Other vitamin B12 deficiency anemias: Secondary | ICD-10-CM | POA: Diagnosis not present

## 2020-10-17 DIAGNOSIS — E782 Mixed hyperlipidemia: Secondary | ICD-10-CM | POA: Diagnosis not present

## 2020-10-17 DIAGNOSIS — I10 Essential (primary) hypertension: Secondary | ICD-10-CM | POA: Diagnosis not present

## 2020-10-17 DIAGNOSIS — E559 Vitamin D deficiency, unspecified: Secondary | ICD-10-CM | POA: Diagnosis not present

## 2020-10-17 LAB — ECHOCARDIOGRAM COMPLETE
Area-P 1/2: 4.68 cm2
Calc EF: 29.2 %
S' Lateral: 6 cm
Single Plane A2C EF: 22.2 %
Single Plane A4C EF: 32.9 %

## 2020-10-17 MED ORDER — FISH OIL 1000 MG PO CAPS: 2.0000 | ORAL_CAPSULE | Freq: Two times a day (BID) | ORAL | 12 refills | Status: DC

## 2020-10-17 NOTE — Progress Notes (Signed)
Complete echocardiogram performed. Contrast needed but not used per patient request.  Jimmy Ahria Slappey RDCS, RVT  

## 2020-10-17 NOTE — Addendum Note (Signed)
Addended by: Eleonore Chiquito on: 10/17/2020 08:42 AM   Modules accepted: Orders

## 2020-10-19 DIAGNOSIS — Z89421 Acquired absence of other right toe(s): Secondary | ICD-10-CM | POA: Diagnosis not present

## 2020-10-19 DIAGNOSIS — E261 Secondary hyperaldosteronism: Secondary | ICD-10-CM | POA: Diagnosis not present

## 2020-10-19 DIAGNOSIS — I110 Hypertensive heart disease with heart failure: Secondary | ICD-10-CM | POA: Diagnosis not present

## 2020-10-19 DIAGNOSIS — E119 Type 2 diabetes mellitus without complications: Secondary | ICD-10-CM | POA: Diagnosis not present

## 2020-10-19 DIAGNOSIS — I509 Heart failure, unspecified: Secondary | ICD-10-CM | POA: Diagnosis not present

## 2020-10-19 DIAGNOSIS — E785 Hyperlipidemia, unspecified: Secondary | ICD-10-CM | POA: Diagnosis not present

## 2020-10-19 DIAGNOSIS — Z6841 Body Mass Index (BMI) 40.0 and over, adult: Secondary | ICD-10-CM | POA: Diagnosis not present

## 2020-10-19 DIAGNOSIS — I2510 Atherosclerotic heart disease of native coronary artery without angina pectoris: Secondary | ICD-10-CM | POA: Diagnosis not present

## 2020-10-19 DIAGNOSIS — J449 Chronic obstructive pulmonary disease, unspecified: Secondary | ICD-10-CM | POA: Diagnosis not present

## 2020-11-01 DIAGNOSIS — J449 Chronic obstructive pulmonary disease, unspecified: Secondary | ICD-10-CM | POA: Diagnosis not present

## 2020-11-01 DIAGNOSIS — I509 Heart failure, unspecified: Secondary | ICD-10-CM | POA: Diagnosis not present

## 2020-11-10 DIAGNOSIS — I509 Heart failure, unspecified: Secondary | ICD-10-CM | POA: Diagnosis not present

## 2020-11-10 DIAGNOSIS — J449 Chronic obstructive pulmonary disease, unspecified: Secondary | ICD-10-CM | POA: Diagnosis not present

## 2020-11-10 DIAGNOSIS — M5440 Lumbago with sciatica, unspecified side: Secondary | ICD-10-CM | POA: Diagnosis not present

## 2020-11-10 DIAGNOSIS — M1712 Unilateral primary osteoarthritis, left knee: Secondary | ICD-10-CM | POA: Diagnosis not present

## 2020-11-16 DIAGNOSIS — E559 Vitamin D deficiency, unspecified: Secondary | ICD-10-CM | POA: Diagnosis not present

## 2020-11-16 DIAGNOSIS — D518 Other vitamin B12 deficiency anemias: Secondary | ICD-10-CM | POA: Diagnosis not present

## 2020-11-16 DIAGNOSIS — E119 Type 2 diabetes mellitus without complications: Secondary | ICD-10-CM | POA: Diagnosis not present

## 2020-11-16 DIAGNOSIS — I10 Essential (primary) hypertension: Secondary | ICD-10-CM | POA: Diagnosis not present

## 2020-11-16 DIAGNOSIS — J410 Simple chronic bronchitis: Secondary | ICD-10-CM | POA: Diagnosis not present

## 2020-11-16 DIAGNOSIS — E1165 Type 2 diabetes mellitus with hyperglycemia: Secondary | ICD-10-CM | POA: Diagnosis not present

## 2020-11-16 DIAGNOSIS — E782 Mixed hyperlipidemia: Secondary | ICD-10-CM | POA: Diagnosis not present

## 2020-11-16 DIAGNOSIS — E038 Other specified hypothyroidism: Secondary | ICD-10-CM | POA: Diagnosis not present

## 2020-11-16 DIAGNOSIS — J449 Chronic obstructive pulmonary disease, unspecified: Secondary | ICD-10-CM | POA: Diagnosis not present

## 2020-11-16 DIAGNOSIS — I2510 Atherosclerotic heart disease of native coronary artery without angina pectoris: Secondary | ICD-10-CM | POA: Diagnosis not present

## 2020-12-01 DIAGNOSIS — I509 Heart failure, unspecified: Secondary | ICD-10-CM | POA: Diagnosis not present

## 2020-12-01 DIAGNOSIS — J449 Chronic obstructive pulmonary disease, unspecified: Secondary | ICD-10-CM | POA: Diagnosis not present

## 2020-12-05 ENCOUNTER — Ambulatory Visit (INDEPENDENT_AMBULATORY_CARE_PROVIDER_SITE_OTHER): Payer: Medicare HMO

## 2020-12-05 DIAGNOSIS — I420 Dilated cardiomyopathy: Secondary | ICD-10-CM | POA: Diagnosis not present

## 2020-12-05 LAB — CUP PACEART REMOTE DEVICE CHECK
Date Time Interrogation Session: 20220711083717
Implantable Lead Implant Date: 20180711
Implantable Lead Location: 753860
Implantable Lead Model: 413997
Implantable Lead Serial Number: 49848420
Implantable Pulse Generator Implant Date: 20130924
Pulse Gen Serial Number: 60669836

## 2020-12-07 DIAGNOSIS — J449 Chronic obstructive pulmonary disease, unspecified: Secondary | ICD-10-CM | POA: Diagnosis not present

## 2020-12-07 DIAGNOSIS — E782 Mixed hyperlipidemia: Secondary | ICD-10-CM | POA: Diagnosis not present

## 2020-12-07 DIAGNOSIS — I4891 Unspecified atrial fibrillation: Secondary | ICD-10-CM | POA: Diagnosis not present

## 2020-12-07 DIAGNOSIS — Z0000 Encounter for general adult medical examination without abnormal findings: Secondary | ICD-10-CM | POA: Diagnosis not present

## 2020-12-07 DIAGNOSIS — E559 Vitamin D deficiency, unspecified: Secondary | ICD-10-CM | POA: Diagnosis not present

## 2020-12-07 DIAGNOSIS — I10 Essential (primary) hypertension: Secondary | ICD-10-CM | POA: Diagnosis not present

## 2020-12-07 DIAGNOSIS — D518 Other vitamin B12 deficiency anemias: Secondary | ICD-10-CM | POA: Diagnosis not present

## 2020-12-07 DIAGNOSIS — E038 Other specified hypothyroidism: Secondary | ICD-10-CM | POA: Diagnosis not present

## 2020-12-07 DIAGNOSIS — I509 Heart failure, unspecified: Secondary | ICD-10-CM | POA: Diagnosis not present

## 2020-12-07 DIAGNOSIS — E119 Type 2 diabetes mellitus without complications: Secondary | ICD-10-CM | POA: Diagnosis not present

## 2020-12-10 DIAGNOSIS — M1712 Unilateral primary osteoarthritis, left knee: Secondary | ICD-10-CM | POA: Diagnosis not present

## 2020-12-10 DIAGNOSIS — J449 Chronic obstructive pulmonary disease, unspecified: Secondary | ICD-10-CM | POA: Diagnosis not present

## 2020-12-10 DIAGNOSIS — I509 Heart failure, unspecified: Secondary | ICD-10-CM | POA: Diagnosis not present

## 2020-12-10 DIAGNOSIS — M5440 Lumbago with sciatica, unspecified side: Secondary | ICD-10-CM | POA: Diagnosis not present

## 2020-12-13 DIAGNOSIS — I2510 Atherosclerotic heart disease of native coronary artery without angina pectoris: Secondary | ICD-10-CM | POA: Diagnosis not present

## 2020-12-13 DIAGNOSIS — E038 Other specified hypothyroidism: Secondary | ICD-10-CM | POA: Diagnosis not present

## 2020-12-13 DIAGNOSIS — J449 Chronic obstructive pulmonary disease, unspecified: Secondary | ICD-10-CM | POA: Diagnosis not present

## 2020-12-13 DIAGNOSIS — E1165 Type 2 diabetes mellitus with hyperglycemia: Secondary | ICD-10-CM | POA: Diagnosis not present

## 2020-12-13 DIAGNOSIS — E559 Vitamin D deficiency, unspecified: Secondary | ICD-10-CM | POA: Diagnosis not present

## 2020-12-13 DIAGNOSIS — I10 Essential (primary) hypertension: Secondary | ICD-10-CM | POA: Diagnosis not present

## 2020-12-13 DIAGNOSIS — E119 Type 2 diabetes mellitus without complications: Secondary | ICD-10-CM | POA: Diagnosis not present

## 2020-12-13 DIAGNOSIS — E782 Mixed hyperlipidemia: Secondary | ICD-10-CM | POA: Diagnosis not present

## 2020-12-13 DIAGNOSIS — D518 Other vitamin B12 deficiency anemias: Secondary | ICD-10-CM | POA: Diagnosis not present

## 2020-12-13 DIAGNOSIS — J410 Simple chronic bronchitis: Secondary | ICD-10-CM | POA: Diagnosis not present

## 2020-12-14 ENCOUNTER — Ambulatory Visit (INDEPENDENT_AMBULATORY_CARE_PROVIDER_SITE_OTHER): Payer: Medicare HMO | Admitting: Cardiology

## 2020-12-14 ENCOUNTER — Other Ambulatory Visit: Payer: Self-pay

## 2020-12-14 ENCOUNTER — Encounter: Payer: Self-pay | Admitting: Cardiology

## 2020-12-14 VITALS — BP 138/88 | HR 90 | Ht 71.0 in | Wt 311.4 lb

## 2020-12-14 DIAGNOSIS — I2510 Atherosclerotic heart disease of native coronary artery without angina pectoris: Secondary | ICD-10-CM

## 2020-12-14 DIAGNOSIS — E119 Type 2 diabetes mellitus without complications: Secondary | ICD-10-CM

## 2020-12-14 DIAGNOSIS — I129 Hypertensive chronic kidney disease with stage 1 through stage 4 chronic kidney disease, or unspecified chronic kidney disease: Secondary | ICD-10-CM

## 2020-12-14 DIAGNOSIS — R7989 Other specified abnormal findings of blood chemistry: Secondary | ICD-10-CM | POA: Insufficient documentation

## 2020-12-14 DIAGNOSIS — I10 Essential (primary) hypertension: Secondary | ICD-10-CM

## 2020-12-14 DIAGNOSIS — I420 Dilated cardiomyopathy: Secondary | ICD-10-CM

## 2020-12-14 DIAGNOSIS — N182 Chronic kidney disease, stage 2 (mild): Secondary | ICD-10-CM | POA: Diagnosis not present

## 2020-12-14 DIAGNOSIS — Z95810 Presence of automatic (implantable) cardiac defibrillator: Secondary | ICD-10-CM | POA: Diagnosis not present

## 2020-12-14 DIAGNOSIS — E782 Mixed hyperlipidemia: Secondary | ICD-10-CM | POA: Diagnosis not present

## 2020-12-14 DIAGNOSIS — I5022 Chronic systolic (congestive) heart failure: Secondary | ICD-10-CM | POA: Diagnosis not present

## 2020-12-14 HISTORY — DX: Other specified abnormal findings of blood chemistry: R79.89

## 2020-12-14 NOTE — Patient Instructions (Signed)
Medication Instructions:  No medication changes. *If you need a refill on your cardiac medications before your next appointment, please call your pharmacy*   Lab Work: Your physician recommends that you have labs done in the office today. Your test included  basic metabolic panel, and liver function.  If you have labs (blood work) drawn today and your tests are completely normal, you will receive your results only by: MyChart Message (if you have MyChart) OR A paper copy in the mail If you have any lab test that is abnormal or we need to change your treatment, we will call you to review the results.   Testing/Procedures: None ordered   Follow-Up: At [facility] you and your health needs are our priority.  As part of our continuing mission to provide you with exceptional heart care, we have created designated Provider Care Teams.  These Care Teams include your primary Cardiologist (physician) and Advanced Practice Providers (APPs -  Physician Assistants and Nurse Practitioners) who all work together to provide you with the care you need, when you need it.  We recommend signing up for the patient portal called "MyChart".  Sign up information is provided on this After Visit Summary.  MyChart is used to connect with patients for Virtual Visits (Telemedicine).  Patients are able to view lab/test results, encounter notes, upcoming appointments, etc.  Non-urgent messages can be sent to your provider as well.   To learn more about what you can do with MyChart, go to [email or web address].    Your next appointment:   4 month(s)  The format for your next appointment:   In Person  Provider:   Belva Crome, MD   Other Instructions NA

## 2020-12-14 NOTE — Progress Notes (Signed)
Cardiology Office Note:    Date:  12/14/2020   ID:  Austin [name] Austin [name], DOB 08-30-1960, MRN [id number]  PCP:  Paulina Fusi, MD  Cardiologist:  Garwin Brothers, MD   Referring MD: Paulina Fusi, MD    ASSESSMENT:    1. Benign hypertension with chronic kidney disease, stage II   2. Dilated cardiomyopathy (HCC)   3. Chronic systolic congestive heart failure (HCC)   4. Coronary artery disease involving native coronary artery of native heart without angina pectoris   5. Mixed hyperlipidemia   6. Essential hypertension   7. Automatic implantable cardioverter-defibrillator in situ   8. Type 2 diabetes mellitus without complication, without long-term current use of insulin (HCC)   9. Elevated LFTs    PLAN:    In order of problems listed above:  Advanced cardiomyopathy with intracardiac defibrillator: I discussed my findings with the patient.  Plan.  He has stable compensated congestive heart failure.  I told him to watch his salt intake in the diet and weigh himself on a regular basis.  Congestive heart failure education was given extensively and questions were answered to his satisfaction.  He promises to do better. Essential hypertension: Blood pressure stable and diet was emphasized.  As he is on multiple medications he will have a Chem-7 today. Elevated LFTs: Stable at this time.  Probably a result of hepatic congestion.  This will be monitored by primary care.  Patient is on statin therapy and tolerating well.  He has known coronary artery disease. Coronary artery disease: Stable secondary prevention stressed any vocalized understanding.   Reviewed.  besity: Weight reduction was stressed diet was emphasized and he promises to do better.  Risks of obesity explained.  Patient will be seen in follow-up appointment in 6 months or earlier if the patient has any concerns    Medication Adjustments/Labs and Tests Ordered: Current medicines are reviewed at length with the patient  today.  Concerns regarding medicines are outlined above.  No orders of the defined types were placed in this encounter.  No orders of the defined types were placed in this encounter.    No chief complaint on file.    History of Present Illness:    Austin [name] is a 60 y.o. male.  Patient has past medical history of advanced cardiomyopathy and severely depressed ejection fraction, systolic congestive heart failure which is stable and compensated, intracardiac defibrillator, essential hypertension dyslipidemia and diabetes mellitus.  He has coronary artery disease.  He denies any problems at this time and takes care of activities of daily living.  No chest pain orthopnea or PND.  He ambulates some but he has orthopedic issues involving his back so he cannot do much.  At the time of my evaluation, the patient is alert awake oriented and in no distress.  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 60 year old male with chronic  low back and bilateral leg pain that appears to correspond to L4 distribution.  Today we will request recently completed plain films of the lumbar spine for review.  Today recommend increasing gabapentin to 400 mg t.i.d., and will provide him with new prescription for this.  He may    Chronic GERD 12/17/2016   Chronic gouty arthritis  10/06/2020   Chronic obstructive pulmonary disease (HCC) 10/06/2020   Chronic systolic congestive heart failure (HCC) 12/17/2016   Controlled diabetes mellitus with stage 2 chronic kidney disease (HCC) 12/17/2016   Coronary artery disease 07/23/2018   cardiac stent x 2  Formatting of this note might be different from the original. cardiac stent x 2   COVID-19 virus infection 05/26/2019   Diabetes mellitus without complication (HCC)    takes insulin   Essential hypertension 10/06/2020   Gout    Heart failure (HCC) 10/06/2020   Hyperglycemia due to type 2 diabetes mellitus (HCC) 10/06/2020   Hyperlipemia    Hypertension    Hypothyroidism 10/06/2020   ICD (implantable cardioverter-defibrillator), single, in situ 05/01/2016   Left upper quadrant pain 10/06/2020   Localized swelling, mass and lump, left upper limb 10/06/2020   Metabolic bone disease 03/26/2017   Mixed hyperlipidemia 10/06/2020   Myocardial infarction [facility])    Osteoarthritis of knee 10/06/2020   Other vitamin B12 deficiency anemias 10/06/2020   Pneumonia 2013   Sciatica 10/06/2020   Seasonal allergic rhinitis 10/06/2020   Shortness of breath    with exertion   Simple chronic bronchitis (HCC)    Take advair inhaler   Testicular hypofunction 10/06/2020   Type 2 diabetes mellitus without complications (HCC) 10/06/2020   Vitamin D deficiency 03/26/2017    Past Surgical History:  Procedure Laterality Date   CARDIAC CATHETERIZATION     stent x 2   MULTIPLE EXTRACTIONS WITH ALVEOLOPLASTY N/A 03/16/2013   Procedure: MULTIPLE EXTRACTION (#9, 10, 11, 12, 13, 14, 15, 18, 20, 21, 22, 23, 24, 25, 26, 27) WITH ALVEOLOPLASTY;  Surgeon: Georgia Lopes, DDS;  Location: [facility] OR;  Service: Oral Surgery;  Laterality: N/A;   TOE SURGERY Right    pinky toe    Current Medications: Current Meds  Medication Sig   allopurinol (ZYLOPRIM) 300 MG tablet Take 300 mg by mouth daily.   amLODipine (NORVASC) 5 MG tablet Take 1 tablet by  mouth daily.   aspirin EC 81 MG tablet Take 1 tablet (81 mg total) by mouth daily.   diclofenac (VOLTAREN) 75 MG EC tablet Take 75 mg by mouth 2 (two) times daily.   fluticasone (FLONASE) 50 MCG/ACT nasal spray Place 1 spray into both nostrils daily.   Fluticasone-Salmeterol (ADVAIR) 100-50 MCG/DOSE AEPB Inhale 1 puff into the lungs every 12 (twelve) hours.   furosemide (LASIX) 40 MG tablet Take 40 mg by mouth daily.    hydrALAZINE (APRESOLINE) 50 MG tablet Take 50 mg by mouth 3 (three) times daily.   levocetirizine (XYZAL) 5 MG tablet Take 5 mg by mouth daily.   metFORMIN (GLUCOPHAGE) 500 MG tablet Take 1 tablet (500 mg total) by mouth 2 (two) times daily with a meal.   sacubitril-valsartan (ENTRESTO) 24-26 MG Take 1 tablet by mouth 2 (two) times daily.   simvastatin (ZOCOR) 40 MG tablet Take 40 mg by mouth daily.   VENTOLIN HFA 108 (90 Base) MCG/ACT inhaler Inhale 1-2 puffs into the lungs every 4 (four) hours as needed for wheezing or shortness of breath.    Vitamin D, Ergocalciferol, (DRISDOL) 1.25 MG (  50000 UNIT) CAPS capsule Take 50,000 Units by mouth once a week.     Allergies:   Patient has no known allergies.   Social History   Socioeconomic History   Marital status: Single    Spouse name: Not on file   Number of children: Not on file   Years of education: Not on file   Highest education level: Not on file  Occupational History   Occupation: unemployed  Tobacco Use   Smoking status: Former   Smokeless tobacco: Never  Vaping Use   Vaping Use: Unknown  Substance and Sexual Activity   Alcohol use: Yes    Alcohol/week: 2.0 standard drinks    Types: 2 Cans of beer per week    Comment: occasional   Drug use: No   Sexual activity: Not on file  Other Topics Concern   Not on file  Social History Narrative   Not on file   Social Determinants of Health   Financial Resource Strain: Not on file  Food Insecurity: Not on file  Transportation Needs: Not on file  Physical  Activity: Not on file  Stress: Not on file  Social Connections: Not on file     Family History: The patient's family history includes Heart attack in his father; Heart disease in his mother.  ROS:   Please see the history of present illness.    All other systems reviewed and are negative.  EKGs/Labs/Other Studies Reviewed:    The following studies were reviewed today: IMPRESSIONS     1. Left ventricular ejection fraction, by estimation, is 20 to 25%. The  left ventricle has severely decreased function. The left ventricle  demonstrates global hypokinesis. The left ventricular internal cavity size  was moderately dilated. There is mild  concentric left ventricular hypertrophy. Impaired relaxation with elevated  left atrial pressure. The average left ventricular global longitudinal  strain is -5.6 %. The global longitudinal strain is abnormal.   2. Right ventricular systolic function is normal. The right ventricular  size is normal. There is normal pulmonary artery systolic pressure.   3. The mitral valve is normal in structure. No evidence of mitral valve  regurgitation. No evidence of mitral stenosis.   4. The aortic valve has an indeterminant number of cusps. Aortic valve  regurgitation is not visualized. Mild aortic valve sclerosis is present,  with no evidence of aortic valve stenosis.   5. Dilated adbdominal/descending aorta (2.4 cm).   6. The inferior vena cava is normal in size with greater than 50%  respiratory variability, suggesting right atrial pressure of 3 mmHg.    Recent Labs: 10/14/2020: ALT 30; BUN 21; Creatinine, Ser 1.25; Hemoglobin 12.8; Platelets 213; Potassium 4.4; Sodium 142; TSH 3.680  Recent Lipid Panel    Component Value Date/Time   CHOL 205 (H) 10/14/2020 0856   TRIG 208 (H) 10/14/2020 0856   HDL 79 10/14/2020 0856   CHOLHDL 2.6 10/14/2020 0856   LDLCALC 92 10/14/2020 0856    Physical Exam:    VS:  BP 138/88   Pulse 90   Ht 5\' 11"  (1.803 m)    Wt (!) 311 lb 6.4 oz (141.3 kg)   SpO2 93%   BMI 43.43 kg/m     Wt Readings from Last 3 Encounters:  12/14/20 (!) 311 lb 6.4 oz (141.3 kg)  10/13/20 (!) 318 lb 6.4 oz (144.4 kg)  10/01/19 275 lb (124.7 kg)     GEN: Patient is in no acute distress HEENT: Normal NECK: No  JVD; No carotid bruits LYMPHATICS: No lymphadenopathy CARDIAC: Hear sounds regular, 2/6 systolic murmur at the apex. RESPIRATORY:  Clear to auscultation without rales, wheezing or rhonchi  ABDOMEN: Soft, non-tender, non-distended MUSCULOSKELETAL:  No edema; No deformity  SKIN: Warm and dry NEUROLOGIC:  Alert and oriented x 3 PSYCHIATRIC:  Normal affect   Signed, Garwin Brothers, MD  12/14/2020 10:23 AM    [facility]

## 2020-12-15 ENCOUNTER — Telehealth: Payer: Self-pay | Admitting: Cardiology

## 2020-12-15 LAB — HEPATIC FUNCTION PANEL
ALT: 35 IU/L (ref 0–44)
AST: 27 IU/L (ref 0–40)
Albumin: 4.8 g/dL (ref 3.8–4.8)
Alkaline Phosphatase: 81 IU/L (ref 44–121)
Bilirubin Total: 0.4 mg/dL (ref 0.0–1.2)
Bilirubin, Direct: 0.15 mg/dL (ref 0.00–0.40)
Total Protein: 7.4 g/dL (ref 6.0–8.5)

## 2020-12-15 LAB — BASIC METABOLIC PANEL
BUN/Creatinine Ratio: 13 (ref 10–24)
BUN: 19 mg/dL (ref 8–27)
CO2: 26 mmol/L (ref 20–29)
Calcium: 10 mg/dL (ref 8.6–10.2)
Chloride: 96 mmol/L (ref 96–106)
Creatinine, Ser: 1.42 mg/dL — ABNORMAL HIGH (ref 0.76–1.27)
Glucose: 134 mg/dL — ABNORMAL HIGH (ref 65–99)
Potassium: 5.1 mmol/L (ref 3.5–5.2)
Sodium: 138 mmol/L (ref 134–144)
eGFR: 56 mL/min/{1.73_m2} — ABNORMAL LOW (ref >59)

## 2020-12-15 NOTE — Telephone Encounter (Signed)
Results reviewed with pt as per Dr. Revankar's note.  Pt verbalized understanding and had no additional questions. Routed to PCP.  

## 2020-12-15 NOTE — Telephone Encounter (Signed)
Patient returning call for lab results. 

## 2020-12-26 NOTE — Progress Notes (Signed)
Remote ICD transmission.   

## 2021-01-01 DIAGNOSIS — I509 Heart failure, unspecified: Secondary | ICD-10-CM | POA: Diagnosis not present

## 2021-01-01 DIAGNOSIS — J449 Chronic obstructive pulmonary disease, unspecified: Secondary | ICD-10-CM | POA: Diagnosis not present

## 2021-01-04 DIAGNOSIS — E782 Mixed hyperlipidemia: Secondary | ICD-10-CM | POA: Diagnosis not present

## 2021-01-04 DIAGNOSIS — E038 Other specified hypothyroidism: Secondary | ICD-10-CM | POA: Diagnosis not present

## 2021-01-04 DIAGNOSIS — E1165 Type 2 diabetes mellitus with hyperglycemia: Secondary | ICD-10-CM | POA: Diagnosis not present

## 2021-01-04 DIAGNOSIS — J449 Chronic obstructive pulmonary disease, unspecified: Secondary | ICD-10-CM | POA: Diagnosis not present

## 2021-01-04 DIAGNOSIS — I10 Essential (primary) hypertension: Secondary | ICD-10-CM | POA: Diagnosis not present

## 2021-01-04 DIAGNOSIS — I2510 Atherosclerotic heart disease of native coronary artery without angina pectoris: Secondary | ICD-10-CM | POA: Diagnosis not present

## 2021-01-04 DIAGNOSIS — J410 Simple chronic bronchitis: Secondary | ICD-10-CM | POA: Diagnosis not present

## 2021-01-04 DIAGNOSIS — E559 Vitamin D deficiency, unspecified: Secondary | ICD-10-CM | POA: Diagnosis not present

## 2021-01-04 DIAGNOSIS — D518 Other vitamin B12 deficiency anemias: Secondary | ICD-10-CM | POA: Diagnosis not present

## 2021-01-04 DIAGNOSIS — E119 Type 2 diabetes mellitus without complications: Secondary | ICD-10-CM | POA: Diagnosis not present

## 2021-01-10 DIAGNOSIS — M5440 Lumbago with sciatica, unspecified side: Secondary | ICD-10-CM | POA: Diagnosis not present

## 2021-01-10 DIAGNOSIS — J449 Chronic obstructive pulmonary disease, unspecified: Secondary | ICD-10-CM | POA: Diagnosis not present

## 2021-01-10 DIAGNOSIS — M1712 Unilateral primary osteoarthritis, left knee: Secondary | ICD-10-CM | POA: Diagnosis not present

## 2021-01-10 DIAGNOSIS — I509 Heart failure, unspecified: Secondary | ICD-10-CM | POA: Diagnosis not present

## 2021-01-31 DIAGNOSIS — E038 Other specified hypothyroidism: Secondary | ICD-10-CM | POA: Diagnosis not present

## 2021-01-31 DIAGNOSIS — J449 Chronic obstructive pulmonary disease, unspecified: Secondary | ICD-10-CM | POA: Diagnosis not present

## 2021-01-31 DIAGNOSIS — E559 Vitamin D deficiency, unspecified: Secondary | ICD-10-CM | POA: Diagnosis not present

## 2021-01-31 DIAGNOSIS — J410 Simple chronic bronchitis: Secondary | ICD-10-CM | POA: Diagnosis not present

## 2021-01-31 DIAGNOSIS — I2510 Atherosclerotic heart disease of native coronary artery without angina pectoris: Secondary | ICD-10-CM | POA: Diagnosis not present

## 2021-01-31 DIAGNOSIS — I10 Essential (primary) hypertension: Secondary | ICD-10-CM | POA: Diagnosis not present

## 2021-01-31 DIAGNOSIS — E119 Type 2 diabetes mellitus without complications: Secondary | ICD-10-CM | POA: Diagnosis not present

## 2021-01-31 DIAGNOSIS — E1165 Type 2 diabetes mellitus with hyperglycemia: Secondary | ICD-10-CM | POA: Diagnosis not present

## 2021-01-31 DIAGNOSIS — D518 Other vitamin B12 deficiency anemias: Secondary | ICD-10-CM | POA: Diagnosis not present

## 2021-01-31 DIAGNOSIS — E782 Mixed hyperlipidemia: Secondary | ICD-10-CM | POA: Diagnosis not present

## 2021-02-01 DIAGNOSIS — J449 Chronic obstructive pulmonary disease, unspecified: Secondary | ICD-10-CM | POA: Diagnosis not present

## 2021-02-01 DIAGNOSIS — I509 Heart failure, unspecified: Secondary | ICD-10-CM | POA: Diagnosis not present

## 2021-02-01 DIAGNOSIS — E1165 Type 2 diabetes mellitus with hyperglycemia: Secondary | ICD-10-CM | POA: Diagnosis not present

## 2021-02-10 DIAGNOSIS — M1712 Unilateral primary osteoarthritis, left knee: Secondary | ICD-10-CM | POA: Diagnosis not present

## 2021-02-10 DIAGNOSIS — M5440 Lumbago with sciatica, unspecified side: Secondary | ICD-10-CM | POA: Diagnosis not present

## 2021-02-10 DIAGNOSIS — I509 Heart failure, unspecified: Secondary | ICD-10-CM | POA: Diagnosis not present

## 2021-02-10 DIAGNOSIS — J449 Chronic obstructive pulmonary disease, unspecified: Secondary | ICD-10-CM | POA: Diagnosis not present

## 2021-03-02 DIAGNOSIS — E782 Mixed hyperlipidemia: Secondary | ICD-10-CM | POA: Diagnosis not present

## 2021-03-02 DIAGNOSIS — I10 Essential (primary) hypertension: Secondary | ICD-10-CM | POA: Diagnosis not present

## 2021-03-02 DIAGNOSIS — M1711 Unilateral primary osteoarthritis, right knee: Secondary | ICD-10-CM | POA: Diagnosis not present

## 2021-03-02 DIAGNOSIS — E119 Type 2 diabetes mellitus without complications: Secondary | ICD-10-CM | POA: Diagnosis not present

## 2021-03-02 DIAGNOSIS — E7849 Other hyperlipidemia: Secondary | ICD-10-CM | POA: Diagnosis not present

## 2021-03-02 DIAGNOSIS — Z79899 Other long term (current) drug therapy: Secondary | ICD-10-CM | POA: Diagnosis not present

## 2021-03-02 DIAGNOSIS — M25561 Pain in right knee: Secondary | ICD-10-CM | POA: Diagnosis not present

## 2021-03-02 DIAGNOSIS — E559 Vitamin D deficiency, unspecified: Secondary | ICD-10-CM | POA: Diagnosis not present

## 2021-03-02 DIAGNOSIS — E038 Other specified hypothyroidism: Secondary | ICD-10-CM | POA: Diagnosis not present

## 2021-03-02 DIAGNOSIS — J449 Chronic obstructive pulmonary disease, unspecified: Secondary | ICD-10-CM | POA: Diagnosis not present

## 2021-03-02 DIAGNOSIS — D518 Other vitamin B12 deficiency anemias: Secondary | ICD-10-CM | POA: Diagnosis not present

## 2021-03-03 DIAGNOSIS — I509 Heart failure, unspecified: Secondary | ICD-10-CM | POA: Diagnosis not present

## 2021-03-03 DIAGNOSIS — J449 Chronic obstructive pulmonary disease, unspecified: Secondary | ICD-10-CM | POA: Diagnosis not present

## 2021-03-06 ENCOUNTER — Ambulatory Visit (INDEPENDENT_AMBULATORY_CARE_PROVIDER_SITE_OTHER): Payer: Medicare HMO

## 2021-03-06 DIAGNOSIS — I429 Cardiomyopathy, unspecified: Secondary | ICD-10-CM

## 2021-03-08 LAB — CUP PACEART REMOTE DEVICE CHECK
Date Time Interrogation Session: 20221011082108
Implantable Lead Implant Date: 20180711
Implantable Lead Location: 753860
Implantable Lead Model: 413997
Implantable Lead Serial Number: 49848420
Implantable Pulse Generator Implant Date: 20130924
Pulse Gen Serial Number: 60669836

## 2021-03-12 DIAGNOSIS — M5440 Lumbago with sciatica, unspecified side: Secondary | ICD-10-CM | POA: Diagnosis not present

## 2021-03-12 DIAGNOSIS — I509 Heart failure, unspecified: Secondary | ICD-10-CM | POA: Diagnosis not present

## 2021-03-12 DIAGNOSIS — M1712 Unilateral primary osteoarthritis, left knee: Secondary | ICD-10-CM | POA: Diagnosis not present

## 2021-03-12 DIAGNOSIS — J449 Chronic obstructive pulmonary disease, unspecified: Secondary | ICD-10-CM | POA: Diagnosis not present

## 2021-03-14 NOTE — Progress Notes (Signed)
Remote ICD transmission.   

## 2021-03-15 DIAGNOSIS — E1165 Type 2 diabetes mellitus with hyperglycemia: Secondary | ICD-10-CM | POA: Diagnosis not present

## 2021-03-27 DIAGNOSIS — I10 Essential (primary) hypertension: Secondary | ICD-10-CM | POA: Diagnosis not present

## 2021-03-27 DIAGNOSIS — E1165 Type 2 diabetes mellitus with hyperglycemia: Secondary | ICD-10-CM | POA: Diagnosis not present

## 2021-03-27 DIAGNOSIS — E559 Vitamin D deficiency, unspecified: Secondary | ICD-10-CM | POA: Diagnosis not present

## 2021-03-27 DIAGNOSIS — I2510 Atherosclerotic heart disease of native coronary artery without angina pectoris: Secondary | ICD-10-CM | POA: Diagnosis not present

## 2021-03-27 DIAGNOSIS — E119 Type 2 diabetes mellitus without complications: Secondary | ICD-10-CM | POA: Diagnosis not present

## 2021-03-27 DIAGNOSIS — E782 Mixed hyperlipidemia: Secondary | ICD-10-CM | POA: Diagnosis not present

## 2021-03-27 DIAGNOSIS — J410 Simple chronic bronchitis: Secondary | ICD-10-CM | POA: Diagnosis not present

## 2021-03-27 DIAGNOSIS — J449 Chronic obstructive pulmonary disease, unspecified: Secondary | ICD-10-CM | POA: Diagnosis not present

## 2021-03-27 DIAGNOSIS — D518 Other vitamin B12 deficiency anemias: Secondary | ICD-10-CM | POA: Diagnosis not present

## 2021-03-27 DIAGNOSIS — E038 Other specified hypothyroidism: Secondary | ICD-10-CM | POA: Diagnosis not present

## 2021-04-03 DIAGNOSIS — I509 Heart failure, unspecified: Secondary | ICD-10-CM | POA: Diagnosis not present

## 2021-04-03 DIAGNOSIS — J449 Chronic obstructive pulmonary disease, unspecified: Secondary | ICD-10-CM | POA: Diagnosis not present

## 2021-04-07 DIAGNOSIS — E038 Other specified hypothyroidism: Secondary | ICD-10-CM | POA: Diagnosis not present

## 2021-04-07 DIAGNOSIS — I2510 Atherosclerotic heart disease of native coronary artery without angina pectoris: Secondary | ICD-10-CM | POA: Diagnosis not present

## 2021-04-07 DIAGNOSIS — D518 Other vitamin B12 deficiency anemias: Secondary | ICD-10-CM | POA: Diagnosis not present

## 2021-04-07 DIAGNOSIS — J449 Chronic obstructive pulmonary disease, unspecified: Secondary | ICD-10-CM | POA: Diagnosis not present

## 2021-04-07 DIAGNOSIS — E119 Type 2 diabetes mellitus without complications: Secondary | ICD-10-CM | POA: Diagnosis not present

## 2021-04-07 DIAGNOSIS — E782 Mixed hyperlipidemia: Secondary | ICD-10-CM | POA: Diagnosis not present

## 2021-04-07 DIAGNOSIS — E559 Vitamin D deficiency, unspecified: Secondary | ICD-10-CM | POA: Diagnosis not present

## 2021-04-07 DIAGNOSIS — I10 Essential (primary) hypertension: Secondary | ICD-10-CM | POA: Diagnosis not present

## 2021-04-07 DIAGNOSIS — J410 Simple chronic bronchitis: Secondary | ICD-10-CM | POA: Diagnosis not present

## 2021-04-07 DIAGNOSIS — E1165 Type 2 diabetes mellitus with hyperglycemia: Secondary | ICD-10-CM | POA: Diagnosis not present

## 2021-04-12 DIAGNOSIS — M1712 Unilateral primary osteoarthritis, left knee: Secondary | ICD-10-CM | POA: Diagnosis not present

## 2021-04-12 DIAGNOSIS — J449 Chronic obstructive pulmonary disease, unspecified: Secondary | ICD-10-CM | POA: Diagnosis not present

## 2021-04-12 DIAGNOSIS — I509 Heart failure, unspecified: Secondary | ICD-10-CM | POA: Diagnosis not present

## 2021-04-12 DIAGNOSIS — M5440 Lumbago with sciatica, unspecified side: Secondary | ICD-10-CM | POA: Diagnosis not present

## 2021-04-24 ENCOUNTER — Ambulatory Visit (INDEPENDENT_AMBULATORY_CARE_PROVIDER_SITE_OTHER): Payer: Medicare HMO | Admitting: Cardiology

## 2021-04-24 ENCOUNTER — Other Ambulatory Visit: Payer: Self-pay

## 2021-04-24 ENCOUNTER — Encounter: Payer: Self-pay | Admitting: Cardiology

## 2021-04-24 VITALS — BP 112/74 | HR 90 | Ht 71.0 in | Wt 303.4 lb

## 2021-04-24 DIAGNOSIS — I429 Cardiomyopathy, unspecified: Secondary | ICD-10-CM

## 2021-04-24 DIAGNOSIS — E782 Mixed hyperlipidemia: Secondary | ICD-10-CM

## 2021-04-24 DIAGNOSIS — I129 Hypertensive chronic kidney disease with stage 1 through stage 4 chronic kidney disease, or unspecified chronic kidney disease: Secondary | ICD-10-CM

## 2021-04-24 DIAGNOSIS — I2510 Atherosclerotic heart disease of native coronary artery without angina pectoris: Secondary | ICD-10-CM

## 2021-04-24 DIAGNOSIS — I10 Essential (primary) hypertension: Secondary | ICD-10-CM | POA: Diagnosis not present

## 2021-04-24 DIAGNOSIS — N182 Chronic kidney disease, stage 2 (mild): Secondary | ICD-10-CM | POA: Diagnosis not present

## 2021-04-24 DIAGNOSIS — I5022 Chronic systolic (congestive) heart failure: Secondary | ICD-10-CM | POA: Diagnosis not present

## 2021-04-24 MED ORDER — FUROSEMIDE 80 MG PO TABS: 80.0000 mg | ORAL_TABLET | Freq: Every day | ORAL | 3 refills | Status: AC

## 2021-04-24 NOTE — Progress Notes (Signed)
Cardiology Office Note:    Date:  04/24/2021   ID:  Austin [name], DOB 12-26-1960, MRN [id number]  PCP:  Paulina Fusi, MD  Cardiologist:  Garwin Brothers, MD   Referring MD: Paulina Fusi, MD    ASSESSMENT:    1. Atherosclerosis of native coronary artery of native heart without angina pectoris   2. Benign hypertension with chronic kidney disease, stage II   3. Cardiomyopathy, unspecified type (HCC)   4. Chronic systolic congestive heart failure (HCC)   5. Essential hypertension   6. Mixed hyperlipidemia    PLAN:    In order of problems listed above:  Congestive heart failure systolic in nature with severely depressed ejection fraction: Echocardiogram was reviewed.  I discussed congestive heart failure issues with the patient including diet and salt intake issues and he vocalized understanding and questions were answered to his satisfaction.  Because of his symptoms of congestive heart failure I will increase furosemide to 80 mg on a daily basis.  I have discontinued Norvasc because of borderline blood pressure.  He knows to go to the nearest emergency room for any concerning symptoms.  He will be having blood work today including a Chem-7 CBC BMP amongst other lab work.  He will be back in a week for a pulse blood pressure and a weight check and Chem-7 also. Essential hypertension: Blood pressure stable and diet was emphasized. Mixed dyslipidemia on statin therapy and will do liver function test because of history of elevated LFTs in the past. Morbid obesity: He is doing his best to diet and lose weight.  Risks of obesity emphasized. Patient had multiple questions which were answered to satisfaction. Patient will be seen in follow-up appointment in 2 months or earlier if the patient has any concerns    Medication Adjustments/Labs and Tests Ordered: Current medicines are reviewed at length with the patient today.  Concerns regarding medicines are outlined above.  No  orders of the defined types were placed in this encounter.  No orders of the defined types were placed in this encounter.    No chief complaint on file.    History of Present Illness:    Austin [name] is a 60 y.o. male. Patient has past medical history of essential hypertension, PD insufficiency, morbid obesity and cardiomyopathy.  He has a defibrillator in place.  He gives history of orthopnea and PND getting worse in the past several weeks.  No chest pain.  At the time of my evaluation, the patient is alert awake oriented and in no distress.  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 60 year old male with chronic low back and bilateral leg pain that appears to correspond to L4 distribution.  Today we will request recently completed plain films of the lumbar spine for review.  Today recommend increasing gabapentin to 400 mg t.i.d., and will provide him with new prescription for  this.  He may    Chronic GERD 12/17/2016   Chronic gouty arthritis 10/06/2020   Chronic obstructive pulmonary disease ([place]) 123XX123   Chronic systolic congestive heart failure ([place]) 12/17/2016   Controlled diabetes mellitus with stage 2 chronic kidney disease ([place]) 12/17/2016   Coronary artery disease 07/23/2018   cardiac stent x 2  Formatting of this note might be  different from the original. cardiac stent x 2   COVID-19 virus infection 05/26/2019   Diabetes mellitus without complication ([place])    takes insulin   Elevated LFTs 12/14/2020   Essential hypertension 10/06/2020   Gout    Heart failure ([place]) 10/06/2020   Hyperglycemia due to type 2 diabetes mellitus ([place]) 10/06/2020   Hyperlipemia    Hypertension    Hypothyroidism 10/06/2020   ICD (implantable cardioverter-defibrillator), single, in situ 05/01/2016   Left upper quadrant pain 10/06/2020   Localized swelling, mass and lump, left upper limb [id number]   Metabolic bone disease [id number]   Mixed hyperlipidemia 10/06/2020   Myocardial infarction [facility])    Osteoarthritis of knee 10/06/2020   Other vitamin B12 deficiency anemias 10/06/2020   Pneumonia 2013   Sciatica 10/06/2020   Seasonal allergic rhinitis 10/06/2020   Shortness of breath    with exertion   Simple chronic bronchitis ([place])    Take advair inhaler   Testicular hypofunction 10/06/2020   Type 2 diabetes mellitus without complications ([place]) [id number]   Vitamin D deficiency 03/26/2017    Past Surgical History:  Procedure Laterality Date   CARDIAC CATHETERIZATION     stent x 2   MULTIPLE EXTRACTIONS WITH ALVEOLOPLASTY N/A 03/16/2013   Procedure: MULTIPLE EXTRACTION (#9, 10, 11, 12, 13, 14, 15, 18, 20, 21, 22, 23, 24, 25, 26, 27) WITH ALVEOLOPLASTY;  Surgeon: Gae Bon, DDS;  Location: [place];  Service: Oral Surgery;  Laterality: N/A;   TOE SURGERY Right    pinky toe    Current Medications: Current Meds  Medication Sig   allopurinol (ZYLOPRIM) 300 MG tablet Take 300 mg by mouth daily.   amLODipine (NORVASC) 5 MG tablet Take 1 tablet by mouth daily.   aspirin EC 81 MG tablet Take 1 tablet (81 mg total) by mouth daily.   diclofenac (VOLTAREN) 75 MG EC tablet Take 75 mg by mouth 2 (two) times daily.   fluticasone (FLONASE) 50 MCG/ACT nasal spray Place 1 spray into both nostrils daily.   Fluticasone-Salmeterol  (ADVAIR) 100-50 MCG/DOSE AEPB Inhale 1 puff into the lungs every 12 (twelve) hours.   furosemide (LASIX) 40 MG tablet Take 40 mg by mouth daily.    hydrALAZINE (APRESOLINE) 50 MG tablet Take 50 mg by mouth 3 (three) times daily.   levocetirizine (XYZAL) 5 MG tablet Take 5 mg by mouth daily.   metFORMIN (GLUCOPHAGE) 500 MG tablet Take 1 tablet (500 mg total) by mouth 2 (two) times daily with a meal.   sacubitril-valsartan (ENTRESTO) 24-26 MG Take 1 tablet by mouth 2 (two) times daily.   simvastatin (ZOCOR) 40 MG tablet Take 40 mg by mouth daily.   VENTOLIN HFA 108 (90 Base) MCG/ACT inhaler Inhale 1-2 puffs into the lungs every 4 (four) hours as needed for wheezing or shortness of breath.    Vitamin D, Ergocalciferol, (DRISDOL) 1.25 MG (50000 UNIT) CAPS capsule Take 50,000 Units by mouth once a week.     Allergies:   Patient has no known allergies.   Social History   Socioeconomic History   Marital status: Single  Spouse name: Not on file   Number of children: Not on file   Years of education: Not on file   Highest education level: Not on file  Occupational History   Occupation: unemployed  Tobacco Use   Smoking status: Former   Smokeless tobacco: Never  Vaping Use   Vaping Use: Unknown  Substance and Sexual Activity   Alcohol use: Yes    Alcohol/week: 2.0 standard drinks    Types: 2 Cans of beer per week    Comment: occasional   Drug use: No   Sexual activity: Not on file  Other Topics Concern   Not on file  Social History Narrative   Not on file   Social Determinants of Health   Financial Resource Strain: Not on file  Food Insecurity: Not on file  Transportation Needs: Not on file  Physical Activity: Not on file  Stress: Not on file  Social Connections: Not on file     Family History: The patient's family history includes Heart attack in his father; Heart disease in his mother.  ROS:   Please see the history of present illness.    All other systems reviewed  and are negative.  EKGs/Labs/Other Studies Reviewed:    The following studies were reviewed today: I discussed my findings with the patient at length.   Recent Labs: 10/14/2020: Hemoglobin 12.8; Platelets 213; TSH 3.680 12/14/2020: ALT 35; BUN 19; Creatinine, Ser 1.42; Potassium 5.1; Sodium 138  Recent Lipid Panel    Component Value Date/Time   CHOL 205 (H) 10/14/2020 0856   TRIG 208 (H) 10/14/2020 0856   HDL 79 10/14/2020 0856   CHOLHDL 2.6 10/14/2020 0856   LDLCALC 92 10/14/2020 0856    Physical Exam:    VS:  BP 112/74   Pulse 90   Ht 5\' 11"  (1.803 m)   Wt (!) 303 lb 6.4 oz (137.6 kg)   SpO2 94%   BMI 42.32 kg/m     Wt Readings from Last 3 Encounters:  04/24/21 (!) 303 lb 6.4 oz (137.6 kg)  12/14/20 (!) 311 lb 6.4 oz (141.3 kg)  10/13/20 (!) 318 lb 6.4 oz (144.4 kg)     GEN: Patient is in no acute distress HEENT: Normal NECK: No JVD; No carotid bruits LYMPHATICS: No lymphadenopathy CARDIAC: Hear sounds regular, 2/6 systolic murmur at the apex. RESPIRATORY:  Clear to auscultation without rales, wheezing or rhonchi  ABDOMEN: Soft, non-tender, non-distended MUSCULOSKELETAL:  No edema; No deformity  SKIN: Warm and dry NEUROLOGIC:  Alert and oriented x 3 PSYCHIATRIC:  Normal affect   Signed, Jenean Lindau, MD  04/24/2021 11:07 AM    [place]

## 2021-04-24 NOTE — Patient Instructions (Signed)
Medication Instructions:  Your physician has recommended you make the following change in your medication:   Stop Amlodipine (Norvasc)  Increase your Furosemide (Lasix) to 80 mg in the morning.  *If you need a refill on your cardiac medications before your next appointment, please call your pharmacy*   Lab Work: Your physician recommends that you have labs done in the office today. Your test included  basic metabolic panel, complete blood count, liver function and BNP.    Your physician recommends that you return for lab work in: 1 week You do not need to fast. This is for a BMET. You can come Monday through Friday 8:30 am to 12:00 pm and 1:15 to 4:30. You do not need to make an appointment as the order has already been placed.   If you have labs (blood work) drawn today and your tests are completely normal, you will receive your results only by: MyChart Message (if you have MyChart) OR A paper copy in the mail If you have any lab test that is abnormal or we need to change your treatment, we will call you to review the results.   Testing/Procedures: None ordered   Follow-Up: At [facility], you and your health needs are our priority.  As part of our continuing mission to provide you with exceptional heart care, we have created designated Provider Care Teams.  These Care Teams include your primary Cardiologist (physician) and Advanced Practice Providers (APPs -  Physician Assistants and Nurse Practitioners) who all work together to provide you with the care you need, when you need it.  We recommend signing up for the patient portal called "MyChart".  Sign up information is provided on this After Visit Summary.  MyChart is used to connect with patients for Virtual Visits (Telemedicine).  Patients are able to view lab/test results, encounter notes, upcoming appointments, etc.  Non-urgent messages can be sent to your provider as well.   To learn more about what you can do with MyChart,  go to [email or web address].    Your next appointment:   2 month(s)  The format for your next appointment:   In Person  Provider:   Belva Crome, MD   Other Instructions NA

## 2021-04-25 LAB — BASIC METABOLIC PANEL
BUN/Creatinine Ratio: 13 (ref 10–24)
BUN: 17 mg/dL (ref 8–27)
CO2: 27 mmol/L (ref 20–29)
Calcium: 9.9 mg/dL (ref 8.6–10.2)
Chloride: 97 mmol/L (ref 96–106)
Creatinine, Ser: 1.31 mg/dL — ABNORMAL HIGH (ref 0.76–1.27)
Glucose: 152 mg/dL — ABNORMAL HIGH (ref 70–99)
Potassium: 4.4 mmol/L (ref 3.5–5.2)
Sodium: 141 mmol/L (ref 134–144)
eGFR: 62 mL/min/{1.73_m2} (ref >59)

## 2021-04-25 LAB — CBC WITH DIFFERENTIAL/PLATELET
Basophils Absolute: 0.1 10*3/uL (ref 0.0–0.2)
Basos: 1 %
EOS (ABSOLUTE): 0.2 10*3/uL (ref 0.0–0.4)
Eos: 3 %
Hematocrit: 40.5 % (ref 37.5–51.0)
Hemoglobin: 13.8 g/dL (ref 13.0–17.7)
Immature Grans (Abs): 0 10*3/uL (ref 0.0–0.1)
Immature Granulocytes: 0 %
Lymphocytes Absolute: 1.3 10*3/uL (ref 0.7–3.1)
Lymphs: 20 %
MCH: 30.3 pg (ref 26.6–33.0)
MCHC: 34.1 g/dL (ref 31.5–35.7)
MCV: 89 fL (ref 79–97)
Monocytes Absolute: 0.5 10*3/uL (ref 0.1–0.9)
Monocytes: 8 %
Neutrophils Absolute: 4.4 10*3/uL (ref 1.4–7.0)
Neutrophils: 68 %
Platelets: 241 10*3/uL (ref 150–450)
RBC: 4.55 x10E6/uL (ref 4.14–5.80)
RDW: 12.4 % (ref 11.6–15.4)
WBC: 6.4 10*3/uL (ref 3.4–10.8)

## 2021-04-25 LAB — HEPATIC FUNCTION PANEL
ALT: 34 IU/L (ref 0–44)
AST: 53 IU/L — ABNORMAL HIGH (ref 0–40)
Albumin: 4.5 g/dL (ref 3.8–4.8)
Alkaline Phosphatase: 94 IU/L (ref 44–121)
Bilirubin Total: 0.4 mg/dL (ref 0.0–1.2)
Bilirubin, Direct: 0.15 mg/dL (ref 0.00–0.40)
Total Protein: 7.5 g/dL (ref 6.0–8.5)

## 2021-04-25 LAB — BRAIN NATRIURETIC PEPTIDE: BNP: 49.2 pg/mL (ref 0.0–100.0)

## 2021-05-01 ENCOUNTER — Ambulatory Visit: Payer: Medicare HMO | Admitting: Emergency Medicine

## 2021-05-01 ENCOUNTER — Other Ambulatory Visit: Payer: Self-pay

## 2021-05-01 ENCOUNTER — Other Ambulatory Visit: Payer: Self-pay | Admitting: Cardiology

## 2021-05-01 ENCOUNTER — Other Ambulatory Visit: Payer: Self-pay | Admitting: *Deleted

## 2021-05-01 VITALS — BP 140/74 | HR 102 | Wt 300.0 lb

## 2021-05-01 DIAGNOSIS — Z79899 Other long term (current) drug therapy: Secondary | ICD-10-CM

## 2021-05-01 DIAGNOSIS — I5022 Chronic systolic (congestive) heart failure: Secondary | ICD-10-CM

## 2021-05-01 NOTE — Progress Notes (Signed)
Reason for visit: check, weight, bp , pulse check and labs after stopping amlodipine and increasing lasix to 80 mg daily.    Name of MD requesting visit: Dr. Tomie China   H&P: Congestive heart failure symptoms per Dr. Tomie China note.   ROS related to problem: Congestive heart failure.   Assessment and plan per MD: No changes per Dr. Bing Matter have patient follow up with Dr. Tomie China tomorrow for his recommendation.

## 2021-05-02 LAB — BASIC METABOLIC PANEL
BUN/Creatinine Ratio: 18 (ref 10–24)
BUN: 22 mg/dL (ref 8–27)
CO2: 26 mmol/L (ref 20–29)
Calcium: 9.8 mg/dL (ref 8.6–10.2)
Chloride: 97 mmol/L (ref 96–106)
Creatinine, Ser: 1.23 mg/dL (ref 0.76–1.27)
Glucose: 133 mg/dL — ABNORMAL HIGH (ref 70–99)
Potassium: 3.9 mmol/L (ref 3.5–5.2)
Sodium: 140 mmol/L (ref 134–144)
eGFR: 67 mL/min/{1.73_m2} (ref >59)

## 2021-05-03 DIAGNOSIS — I509 Heart failure, unspecified: Secondary | ICD-10-CM | POA: Diagnosis not present

## 2021-05-03 DIAGNOSIS — J449 Chronic obstructive pulmonary disease, unspecified: Secondary | ICD-10-CM | POA: Diagnosis not present

## 2021-05-03 DIAGNOSIS — E1165 Type 2 diabetes mellitus with hyperglycemia: Secondary | ICD-10-CM | POA: Diagnosis not present

## 2021-05-12 DIAGNOSIS — E559 Vitamin D deficiency, unspecified: Secondary | ICD-10-CM | POA: Diagnosis not present

## 2021-05-12 DIAGNOSIS — I509 Heart failure, unspecified: Secondary | ICD-10-CM | POA: Diagnosis not present

## 2021-05-12 DIAGNOSIS — I2510 Atherosclerotic heart disease of native coronary artery without angina pectoris: Secondary | ICD-10-CM | POA: Diagnosis not present

## 2021-05-12 DIAGNOSIS — M5440 Lumbago with sciatica, unspecified side: Secondary | ICD-10-CM | POA: Diagnosis not present

## 2021-05-12 DIAGNOSIS — D518 Other vitamin B12 deficiency anemias: Secondary | ICD-10-CM | POA: Diagnosis not present

## 2021-05-12 DIAGNOSIS — E038 Other specified hypothyroidism: Secondary | ICD-10-CM | POA: Diagnosis not present

## 2021-05-12 DIAGNOSIS — E1165 Type 2 diabetes mellitus with hyperglycemia: Secondary | ICD-10-CM | POA: Diagnosis not present

## 2021-05-12 DIAGNOSIS — E119 Type 2 diabetes mellitus without complications: Secondary | ICD-10-CM | POA: Diagnosis not present

## 2021-05-12 DIAGNOSIS — I10 Essential (primary) hypertension: Secondary | ICD-10-CM | POA: Diagnosis not present

## 2021-05-12 DIAGNOSIS — E782 Mixed hyperlipidemia: Secondary | ICD-10-CM | POA: Diagnosis not present

## 2021-05-12 DIAGNOSIS — J410 Simple chronic bronchitis: Secondary | ICD-10-CM | POA: Diagnosis not present

## 2021-05-12 DIAGNOSIS — M1712 Unilateral primary osteoarthritis, left knee: Secondary | ICD-10-CM | POA: Diagnosis not present

## 2021-05-12 DIAGNOSIS — J449 Chronic obstructive pulmonary disease, unspecified: Secondary | ICD-10-CM | POA: Diagnosis not present

## 2021-05-14 DIAGNOSIS — R471 Dysarthria and anarthria: Secondary | ICD-10-CM | POA: Diagnosis not present

## 2021-05-14 DIAGNOSIS — Z20822 Contact with and (suspected) exposure to covid-19: Secondary | ICD-10-CM | POA: Diagnosis not present

## 2021-05-14 DIAGNOSIS — Z87891 Personal history of nicotine dependence: Secondary | ICD-10-CM | POA: Diagnosis not present

## 2021-05-14 DIAGNOSIS — R262 Difficulty in walking, not elsewhere classified: Secondary | ICD-10-CM | POA: Diagnosis not present

## 2021-05-14 DIAGNOSIS — R400 Somnolence: Secondary | ICD-10-CM | POA: Diagnosis not present

## 2021-05-14 DIAGNOSIS — R531 Weakness: Secondary | ICD-10-CM | POA: Diagnosis not present

## 2021-05-14 DIAGNOSIS — Z7982 Long term (current) use of aspirin: Secondary | ICD-10-CM | POA: Diagnosis not present

## 2021-05-14 DIAGNOSIS — R4781 Slurred speech: Secondary | ICD-10-CM | POA: Diagnosis not present

## 2021-05-14 DIAGNOSIS — Z79899 Other long term (current) drug therapy: Secondary | ICD-10-CM | POA: Diagnosis not present

## 2021-05-14 DIAGNOSIS — R29810 Facial weakness: Secondary | ICD-10-CM | POA: Diagnosis not present

## 2021-05-15 DIAGNOSIS — R4781 Slurred speech: Secondary | ICD-10-CM | POA: Diagnosis not present

## 2021-05-15 DIAGNOSIS — R5383 Other fatigue: Secondary | ICD-10-CM | POA: Diagnosis not present

## 2021-05-15 DIAGNOSIS — I6389 Other cerebral infarction: Secondary | ICD-10-CM | POA: Diagnosis not present

## 2021-05-15 DIAGNOSIS — I6523 Occlusion and stenosis of bilateral carotid arteries: Secondary | ICD-10-CM | POA: Diagnosis not present

## 2021-05-15 DIAGNOSIS — R29810 Facial weakness: Secondary | ICD-10-CM | POA: Diagnosis not present

## 2021-05-28 IMAGING — DX DG CHEST 1V PORT
1 series · 1 of 1 positions shown · non-contrast
Comparison: Chest radiograph from one day prior.

CLINICAL DATA: Dyspnea

EXAM:
PORTABLE CHEST 1 VIEW

[chest]
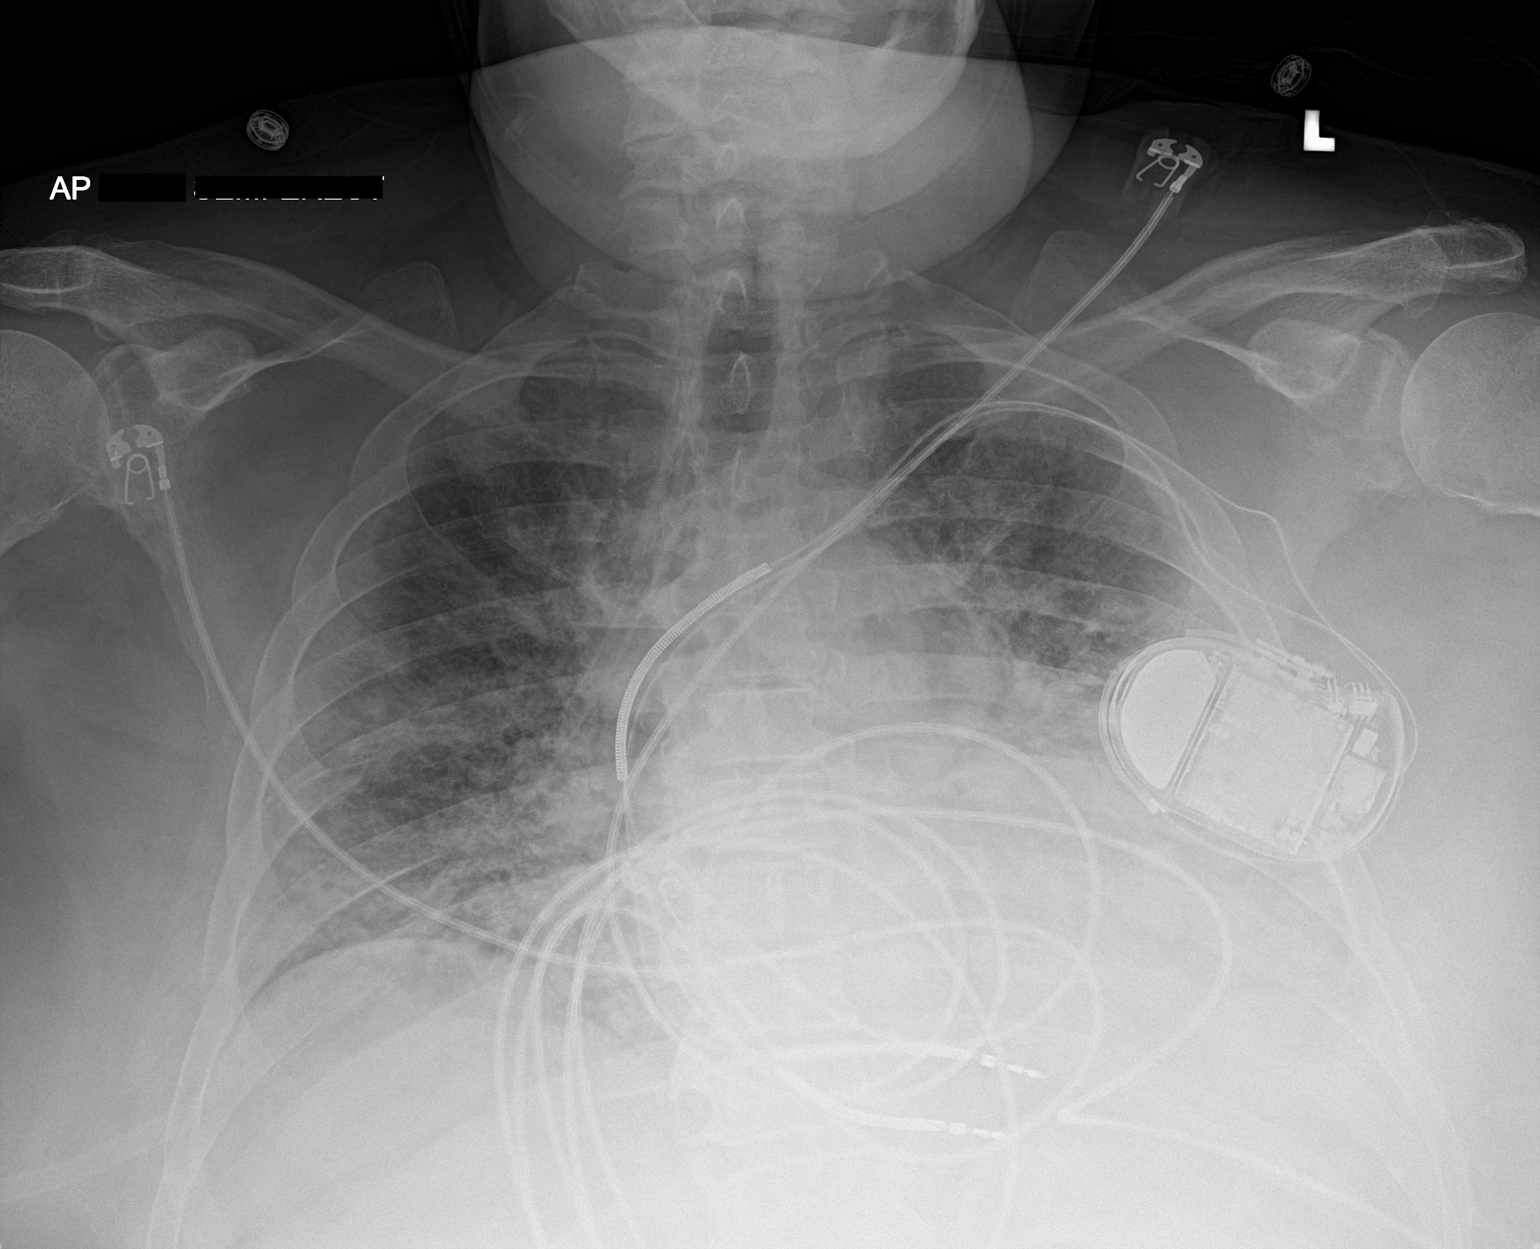

[1 of 1 positions shown; findings below may reference images not displayed]

FINDINGS: Two lead left subclavian ICD is stable in configuration. Stable
cardiomediastinal silhouette with mild cardiomegaly. No
pneumothorax. No pleural effusion. Extensive patchy parahilar and
lower lung opacities bilaterally, worsened.
IMPRESSION: Cardiomegaly. Worsening extensive patchy parahilar and lower lung
opacities bilaterally, favor cardiogenic pulmonary edema due to
heart failure given the rapid change, with pneumonia not excluded.

## 2021-05-30 DIAGNOSIS — E038 Other specified hypothyroidism: Secondary | ICD-10-CM | POA: Diagnosis not present

## 2021-05-30 DIAGNOSIS — J449 Chronic obstructive pulmonary disease, unspecified: Secondary | ICD-10-CM | POA: Diagnosis not present

## 2021-05-30 DIAGNOSIS — E1165 Type 2 diabetes mellitus with hyperglycemia: Secondary | ICD-10-CM | POA: Diagnosis not present

## 2021-05-30 DIAGNOSIS — E782 Mixed hyperlipidemia: Secondary | ICD-10-CM | POA: Diagnosis not present

## 2021-05-30 DIAGNOSIS — J410 Simple chronic bronchitis: Secondary | ICD-10-CM | POA: Diagnosis not present

## 2021-05-30 DIAGNOSIS — I2510 Atherosclerotic heart disease of native coronary artery without angina pectoris: Secondary | ICD-10-CM | POA: Diagnosis not present

## 2021-05-30 DIAGNOSIS — E559 Vitamin D deficiency, unspecified: Secondary | ICD-10-CM | POA: Diagnosis not present

## 2021-05-30 DIAGNOSIS — D518 Other vitamin B12 deficiency anemias: Secondary | ICD-10-CM | POA: Diagnosis not present

## 2021-05-30 DIAGNOSIS — E119 Type 2 diabetes mellitus without complications: Secondary | ICD-10-CM | POA: Diagnosis not present

## 2021-05-30 DIAGNOSIS — I10 Essential (primary) hypertension: Secondary | ICD-10-CM | POA: Diagnosis not present

## 2021-06-03 DIAGNOSIS — I509 Heart failure, unspecified: Secondary | ICD-10-CM | POA: Diagnosis not present

## 2021-06-03 DIAGNOSIS — J449 Chronic obstructive pulmonary disease, unspecified: Secondary | ICD-10-CM | POA: Diagnosis not present

## 2021-06-05 ENCOUNTER — Ambulatory Visit (INDEPENDENT_AMBULATORY_CARE_PROVIDER_SITE_OTHER): Payer: Medicare HMO

## 2021-06-05 DIAGNOSIS — I429 Cardiomyopathy, unspecified: Secondary | ICD-10-CM | POA: Diagnosis not present

## 2021-06-05 LAB — CUP PACEART REMOTE DEVICE CHECK
Date Time Interrogation Session: 20230107090802
Implantable Lead Implant Date: 20180711
Implantable Lead Location: 753860
Implantable Lead Model: 413997
Implantable Lead Serial Number: 49848420
Implantable Pulse Generator Implant Date: 20130924
Pulse Gen Serial Number: 60669836

## 2021-06-12 DIAGNOSIS — M5440 Lumbago with sciatica, unspecified side: Secondary | ICD-10-CM | POA: Diagnosis not present

## 2021-06-12 DIAGNOSIS — J449 Chronic obstructive pulmonary disease, unspecified: Secondary | ICD-10-CM | POA: Diagnosis not present

## 2021-06-12 DIAGNOSIS — I509 Heart failure, unspecified: Secondary | ICD-10-CM | POA: Diagnosis not present

## 2021-06-12 DIAGNOSIS — M1712 Unilateral primary osteoarthritis, left knee: Secondary | ICD-10-CM | POA: Diagnosis not present

## 2021-06-13 NOTE — Progress Notes (Signed)
Remote ICD transmission.   

## 2021-06-27 ENCOUNTER — Encounter: Payer: Self-pay | Admitting: Cardiology

## 2021-06-27 ENCOUNTER — Other Ambulatory Visit: Payer: Self-pay

## 2021-06-27 ENCOUNTER — Ambulatory Visit (INDEPENDENT_AMBULATORY_CARE_PROVIDER_SITE_OTHER): Payer: Medicare HMO | Admitting: Cardiology

## 2021-06-27 VITALS — BP 146/86 | HR 90 | Ht 71.0 in | Wt 307.6 lb

## 2021-06-27 DIAGNOSIS — I255 Ischemic cardiomyopathy: Secondary | ICD-10-CM | POA: Diagnosis not present

## 2021-06-27 DIAGNOSIS — I4891 Unspecified atrial fibrillation: Secondary | ICD-10-CM

## 2021-06-27 DIAGNOSIS — M25561 Pain in right knee: Secondary | ICD-10-CM

## 2021-06-27 DIAGNOSIS — Z89429 Acquired absence of other toe(s), unspecified side: Secondary | ICD-10-CM

## 2021-06-27 DIAGNOSIS — E785 Hyperlipidemia, unspecified: Secondary | ICD-10-CM

## 2021-06-27 DIAGNOSIS — I10 Essential (primary) hypertension: Secondary | ICD-10-CM | POA: Diagnosis not present

## 2021-06-27 DIAGNOSIS — E782 Mixed hyperlipidemia: Secondary | ICD-10-CM

## 2021-06-27 DIAGNOSIS — I2510 Atherosclerotic heart disease of native coronary artery without angina pectoris: Secondary | ICD-10-CM

## 2021-06-27 DIAGNOSIS — M1991 Primary osteoarthritis, unspecified site: Secondary | ICD-10-CM

## 2021-06-27 DIAGNOSIS — Z95810 Presence of automatic (implantable) cardiac defibrillator: Secondary | ICD-10-CM

## 2021-06-27 HISTORY — DX: Unspecified atrial fibrillation: I48.91

## 2021-06-27 HISTORY — DX: Hyperlipidemia, unspecified: E78.5

## 2021-06-27 HISTORY — DX: Acquired absence of other toe(s), unspecified side: Z89.429

## 2021-06-27 HISTORY — DX: Primary osteoarthritis, unspecified site: M19.91

## 2021-06-27 HISTORY — DX: Pain in right knee: M25.561

## 2021-06-27 HISTORY — DX: Morbid (severe) obesity due to excess calories: E66.01

## 2021-06-27 NOTE — Progress Notes (Signed)
Cardiology Office Note:    Date:  06/27/2021   ID:  Austin [name], DOB 10-Dec-1960, MRN [id number]  PCP:  Nicoletta Dress, MD  Cardiologist:  Jenean Lindau, MD   Referring MD: Nicoletta Dress, MD    ASSESSMENT:    1. Coronary artery disease involving native coronary artery of native heart without angina pectoris   2. Essential hypertension   3. Ischemic cardiomyopathy   4. Mixed hyperlipidemia   5. ICD (implantable cardioverter-defibrillator), single, in situ    PLAN:    In order of problems listed above:  Coronary artery disease and ischemic cardiomyopathy: Secondary prevention stressed with the patient.  Importance of compliance with diet medication stressed and vocalized understanding.  He was advised to walk on a regular basis. Ischemic cardiomyopathy: I reviewed echocardiogram with the patient at length.  He has a defibrillator.  Guideline directed therapy is in place.  His blood pressures are fine at home. Intracardiac defibrillator: Managed by electrophysiology colleagues Essential hypertension: Blood pressure stable and diet was emphasized.  His blood pressure readings are fine at home.  Salt intake issues were discussed. Mixed dyslipidemia diabetes mellitus and obesity: Lipids followed by primary care.  I told him to be ambulatory diet and lose weight and he promises to do better.  Risks of obesity explained and he understands. Patient will be seen in follow-up appointment in 6 months or earlier if the patient has any concerns    Medication Adjustments/Labs and Tests Ordered: Current medicines are reviewed at length with the patient today.  Concerns regarding medicines are outlined above.  No orders of the defined types were placed in this encounter.  No orders of the defined types were placed in this encounter.    No chief complaint on file.    History of Present Illness:    Austin [name] is a 61 y.o. male.  Patient has past medical history of  coronary artery disease, ischemic cardiomyopathy, essential hypertension, dyslipidemia and diabetes mellitus.  He is morbidly obese.  He leads a sedentary lifestyle.  He denies any chest pain orthopnea or PND.  At the time of my evaluation, the patient is alert awake oriented and in no distress.  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 61 year old male with chronic low back and bilateral leg pain that appears to correspond to L4 distribution.  Today we will request recently completed plain films of the lumbar spine for review.  Today recommend increasing gabapentin to 400 mg t.i.d., and will provide him with new prescription for this.  He may    Chronic GERD 12/17/2016   Chronic gouty arthritis 10/06/2020   Chronic obstructive pulmonary disease ([place]) 123XX123   Chronic systolic congestive heart failure ([place]) 12/17/2016   Controlled diabetes mellitus with stage 2 chronic kidney disease ([place]) 12/17/2016   Coronary artery disease 07/23/2018   cardiac stent x 2  Formatting of  this note might be different from the original. cardiac stent x 2   COVID-19 virus infection 05/26/2019   Diabetes mellitus without complication ([place])    takes insulin   Elevated LFTs 12/14/2020   Essential hypertension 10/06/2020   Gout    Heart failure ([place]) 10/06/2020    Hyperglycemia due to type 2 diabetes mellitus ([place]) 10/06/2020   Hyperlipemia    Hypertension    Hypothyroidism 10/06/2020   ICD (implantable cardioverter-defibrillator), single, in situ 05/01/2016   Left upper quadrant pain 10/06/2020   Localized swelling, mass and lump, left upper limb 123XX123   Metabolic bone disease [id number]   Mixed hyperlipidemia 10/06/2020   Myocardial infarction [facility])    Osteoarthritis of knee 10/06/2020   Other vitamin B12 deficiency anemias 10/06/2020   Pneumonia 2013   Sciatica 10/06/2020   Seasonal allergic rhinitis 10/06/2020   Shortness of breath    with exertion   Simple chronic bronchitis ([facility])    Take advair inhaler   Testicular hypofunction 10/06/2020   Type 2 diabetes mellitus without complications ([place]) 123XX123   Vitamin D deficiency 03/26/2017    Past Surgical History:  Procedure Laterality Date   CARDIAC CATHETERIZATION     stent x 2   MULTIPLE EXTRACTIONS WITH ALVEOLOPLASTY N/A 03/16/2013   Procedure: MULTIPLE EXTRACTION (#9, 10, 11, 12, 13, 14, 15, 18, 20, 21, 22, 23, 24, 25, 26, 27) WITH ALVEOLOPLASTY;  Surgeon: Gae Bon, DDS;  Location: [facility];  Service: Oral Surgery;  Laterality: N/A;   TOE SURGERY Right    pinky toe    Current Medications: Current Meds  Medication Sig   allopurinol (ZYLOPRIM) 300 MG tablet Take 300 mg by mouth daily.   aspirin EC 81 MG tablet Take 1 tablet (81 mg total) by mouth daily.   diclofenac (VOLTAREN) 75 MG EC tablet Take 75 mg by mouth 2 (two) times daily.   fluticasone (FLONASE) 50 MCG/ACT nasal spray Place 1 spray into both nostrils daily.   Fluticasone-Salmeterol (ADVAIR) 100-50 MCG/DOSE AEPB Inhale 1 puff into the lungs every 12 (twelve) hours.   furosemide (LASIX) 80 MG tablet Take 1 tablet (80 mg total) by mouth daily.   hydrALAZINE (APRESOLINE) 50 MG tablet Take 50 mg by mouth 3 (three) times daily.   levocetirizine (XYZAL) 5 MG tablet Take 5 mg by mouth daily.   metFORMIN  (GLUCOPHAGE) 500 MG tablet Take 1 tablet (500 mg total) by mouth 2 (two) times daily with a meal.   metoprolol succinate (TOPROL-XL) 50 MG 24 hr tablet Take 50 mg by mouth daily.   sacubitril-valsartan (ENTRESTO) 24-26 MG Take 1 tablet by mouth 2 (two) times daily.   simvastatin (ZOCOR) 40 MG tablet Take 40 mg by mouth daily.   VENTOLIN HFA 108 (90 Base) MCG/ACT inhaler Inhale 1-2 puffs into the lungs every 4 (four) hours as needed for wheezing or shortness of breath.    Vitamin D, Ergocalciferol, (DRISDOL) 1.25 MG (50000 UNIT) CAPS capsule Take 50,000 Units by mouth once a week.     Allergies:   Patient has no known allergies.   Social History   Socioeconomic History   Marital status: Single    Spouse name: Not on file   Number of children: Not on file   Years of education: Not on file   Highest education level: Not on file  Occupational History   Occupation: unemployed  Tobacco Use   Smoking status: Former   Smokeless tobacco: Never  Vaping Use   Vaping Use:  Unknown  Substance and Sexual Activity   Alcohol use: Yes    Alcohol/week: 2.0 standard drinks    Types: 2 Cans of beer per week    Comment: occasional   Drug use: No   Sexual activity: Not on file  Other Topics Concern   Not on file  Social History Narrative   Not on file   Social Determinants of Health   Financial Resource Strain: Not on file  Food Insecurity: Not on file  Transportation Needs: Not on file  Physical Activity: Not on file  Stress: Not on file  Social Connections: Not on file     Family History: The patient's family history includes Heart attack in his father; Heart disease in his mother.  ROS:   Please see the history of present illness.    All other systems reviewed and are negative.  EKGs/Labs/Other Studies Reviewed:    The following studies were reviewed today: I discussed my findings with the patient at length   Recent Labs: 10/14/2020: TSH 3.680 04/24/2021: ALT 34; BNP 49.2;  Hemoglobin 13.8; Platelets 241 05/01/2021: BUN 22; Creatinine, Ser 1.23; Potassium 3.9; Sodium 140  Recent Lipid Panel    Component Value Date/Time   CHOL 205 (H) 10/14/2020 0856   TRIG 208 (H) 10/14/2020 0856   HDL 79 10/14/2020 0856   CHOLHDL 2.6 10/14/2020 0856   LDLCALC 92 10/14/2020 0856    Physical Exam:    VS:  BP (!) 146/86    Pulse 90    Ht 5\' 11"  (1.803 m)    Wt (!) 307 lb 9.6 oz (139.5 kg)    SpO2 96%    BMI 42.90 kg/m     Wt Readings from Last 3 Encounters:  06/27/21 (!) 307 lb 9.6 oz (139.5 kg)  05/01/21 300 lb (136.1 kg)  04/24/21 (!) 303 lb 6.4 oz (137.6 kg)     GEN: Patient is in no acute distress HEENT: Normal NECK: No JVD; No carotid bruits LYMPHATICS: No lymphadenopathy CARDIAC: Hear sounds regular, 2/6 systolic murmur at the apex. RESPIRATORY:  Clear to auscultation without rales, wheezing or rhonchi  ABDOMEN: Soft, non-tender, non-distended MUSCULOSKELETAL:  No edema; No deformity  SKIN: Warm and dry NEUROLOGIC:  Alert and oriented x 3 PSYCHIATRIC:  Normal affect   Signed, Jenean Lindau, MD  06/27/2021 3:53 PM    [facility]

## 2021-06-27 NOTE — Patient Instructions (Signed)

## 2021-06-28 DIAGNOSIS — J410 Simple chronic bronchitis: Secondary | ICD-10-CM | POA: Diagnosis not present

## 2021-06-28 DIAGNOSIS — I2510 Atherosclerotic heart disease of native coronary artery without angina pectoris: Secondary | ICD-10-CM | POA: Diagnosis not present

## 2021-06-28 DIAGNOSIS — E559 Vitamin D deficiency, unspecified: Secondary | ICD-10-CM | POA: Diagnosis not present

## 2021-06-28 DIAGNOSIS — I10 Essential (primary) hypertension: Secondary | ICD-10-CM | POA: Diagnosis not present

## 2021-06-28 DIAGNOSIS — E782 Mixed hyperlipidemia: Secondary | ICD-10-CM | POA: Diagnosis not present

## 2021-06-28 DIAGNOSIS — E038 Other specified hypothyroidism: Secondary | ICD-10-CM | POA: Diagnosis not present

## 2021-06-28 DIAGNOSIS — J449 Chronic obstructive pulmonary disease, unspecified: Secondary | ICD-10-CM | POA: Diagnosis not present

## 2021-06-28 DIAGNOSIS — D518 Other vitamin B12 deficiency anemias: Secondary | ICD-10-CM | POA: Diagnosis not present

## 2021-06-28 DIAGNOSIS — E119 Type 2 diabetes mellitus without complications: Secondary | ICD-10-CM | POA: Diagnosis not present

## 2021-06-28 DIAGNOSIS — E1165 Type 2 diabetes mellitus with hyperglycemia: Secondary | ICD-10-CM | POA: Diagnosis not present

## 2021-06-29 DIAGNOSIS — K648 Other hemorrhoids: Secondary | ICD-10-CM | POA: Diagnosis not present

## 2021-06-29 DIAGNOSIS — K5790 Diverticulosis of intestine, part unspecified, without perforation or abscess without bleeding: Secondary | ICD-10-CM | POA: Diagnosis not present

## 2021-06-30 ENCOUNTER — Telehealth: Payer: Self-pay

## 2021-06-30 NOTE — Telephone Encounter (Signed)
° °  Pre-operative Risk Assessment    Patient Name: Austin [name]  DOB: 06-01-1960 MRN: [id number]      Request for Surgical Clearance    Procedure:   Colonoscopy  Date of Surgery:  Clearance 07/20/21                                 Surgeon:  Dr. Marcial Pacas [place] Surgeon's Group or Practice Name:  [facility] Phone number:  [phone] Fax number:  [phone]   Type of Clearance Requested:   - Pharmacy:  Hold Aspirin on Feb 18   Type of Anesthesia:  Not Indicated   Additional requests/questions:   Patient has defibrillator please advise regarding protocol during the procedure  Signed, Dione Housekeeper   06/30/2021, 8:30 AM

## 2021-06-30 NOTE — Telephone Encounter (Signed)
LVM to call back and ask for pre-op team 

## 2021-07-04 DIAGNOSIS — J449 Chronic obstructive pulmonary disease, unspecified: Secondary | ICD-10-CM | POA: Diagnosis not present

## 2021-07-04 DIAGNOSIS — I509 Heart failure, unspecified: Secondary | ICD-10-CM | POA: Diagnosis not present

## 2021-07-04 NOTE — Telephone Encounter (Addendum)
° °  Patient Name: Austin [name]  DOB: 09-08-1960 MRN: [id number]  Primary Cardiologist: Garwin Brothers, MD  Chart reviewed as part of pre-operative protocol coverage. Patient has hx of CAD, ICM, defibrillator followed by EP, HTN, dyslipidemia, DM, obesity. Dr. Kem Parkinson last note indicates prior hx of cardiac stent x 2. Prior cath notes from 2002 outline hx of inferior wall MI 01/2001 with PCI to the RCA with reocclusion of the RCA 02/2001 and moderate residual disease of Lcx. Last echo 04/2021 EF 30-35%.  We do not normally need to hold baby ASA for colonoscopy. Will route to callback to clarify if this is necessary, before routing to MD for clearance.   GI clinic also inquiring about defibrillator so will route to device clinic for them to relay recommendations as requested.   Laurann Montana, PA-C 07/04/2021, 9:47 AM

## 2021-07-04 NOTE — Telephone Encounter (Signed)
Spoke with patient, last inclinic device check was 04/09/2019.  We cannot provide device clearance/ recommendation until device is checked in person.  Advised patient I would forward tos cheduling to contact him for an in-clinic appt that may have to be in [place]

## 2021-07-05 NOTE — Telephone Encounter (Signed)
Pt has appt 07/21/21 with Francis Dowse, PAC. Unfortunately we were unable to get the pt in before her scheduled procedure date. I will update the requesting office procedure to be postponed until cleared by cardiology. I will send notes to [facility] for upcoming appt.

## 2021-07-10 NOTE — Telephone Encounter (Deleted)
Will route to callback to clarify ASA question as well - see 07/04/21 phone note to clarify whether we need to hold ASA for colonoscopy (do not usually need to hold - if we do, will need MD input).

## 2021-07-10 NOTE — Telephone Encounter (Signed)
Left message x 2 for call back about ASA

## 2021-07-10 NOTE — Telephone Encounter (Signed)
Left message for Dr. Camila Li nurse to please call back and confirm if ASA is truly needing to be held. See notes from Ronie Spies, [facility].

## 2021-07-11 NOTE — Telephone Encounter (Signed)
Hi Dr. Geraldo Pitter. Patient has an upcoming colonoscopy planned and they have asked that Aspirin be held. Patient has a history of CAD with history of inferior wall MI in 01/2001 with PCI to RCA and reocclusion of the RCA in 2002. You recently saw the patient on 06/27/2021 at which time denied any chest pain, orthopnea, or PND; however, he is morbidly obese and leads a sedentary lifestyle. He was encouraged to walk on a regular basis. Can you please comment on if patient is OK to proceed with this low risk procedure? Can you also please comment on how long Aspirin can be held for this?  Please route response back to P CV DIV PREOP.  Thank you! Earlene Bjelland

## 2021-07-11 NOTE — Telephone Encounter (Signed)
° ° °  Patient Name: Austin [name]  DOB: 11-08-1960 MRN: [id number]  Primary Cardiologist: Garwin Brothers, MD  Chart reviewed as part of pre-operative protocol coverage. Patient has an upcoming colonoscopy planned and we were asked to give our recommendation for holding Aspirin. I discussed with Dr. Tomie China who recommended patient stay on Aspirin perioperatively given patient's history of CAD with prior stents unless performing physician feels that procedure cannot be done on Aspirin. If procedure can absolutely not be done while on Aspirin, could hold for 5-7 days beforehand and then restart as soon as safely possible afterwards.   Patient has an appointment in our EP clinic on 07/20/2021 at which time device recommendations can be given.  I will route this recommendation to the requesting party via Epic fax function and remove from pre-op pool.  Please call with questions.  Corrin Parker, PA-C 07/11/2021, 3:31 PM

## 2021-07-11 NOTE — Telephone Encounter (Signed)
Austin [name] with Dr. Camila Li office called back and did confirm yes that Dr. Jennye Boroughs does need to hold ASA for procedure. Austin [name] is aware as well that the pt has appt 07/21/21 with Francis Dowse, PAC as they are needing device clearance as well. I assured Austin [name], that I will update all parties involved of the conversation today. I thanked Austin [name] for the help.

## 2021-07-13 ENCOUNTER — Telehealth: Payer: Self-pay

## 2021-07-13 DIAGNOSIS — I509 Heart failure, unspecified: Secondary | ICD-10-CM | POA: Diagnosis not present

## 2021-07-13 DIAGNOSIS — J449 Chronic obstructive pulmonary disease, unspecified: Secondary | ICD-10-CM | POA: Diagnosis not present

## 2021-07-13 DIAGNOSIS — M1712 Unilateral primary osteoarthritis, left knee: Secondary | ICD-10-CM | POA: Diagnosis not present

## 2021-07-13 DIAGNOSIS — M5440 Lumbago with sciatica, unspecified side: Secondary | ICD-10-CM | POA: Diagnosis not present

## 2021-07-13 NOTE — Telephone Encounter (Signed)
[employer] monitor not connect for >21 days. Attempted to contact patient to inquire, no answer. LMTCB.

## 2021-07-13 NOTE — Telephone Encounter (Signed)
I spoke with the patient and he unplug the monitor and plug it in a different room. It showed that the battery was charging. I gave him the number to [employer] for additional help.

## 2021-07-14 NOTE — Telephone Encounter (Signed)
Pt transmitted 07/14/2021

## 2021-07-21 ENCOUNTER — Encounter: Payer: Self-pay | Admitting: Physician Assistant

## 2021-07-21 ENCOUNTER — Ambulatory Visit (INDEPENDENT_AMBULATORY_CARE_PROVIDER_SITE_OTHER): Payer: Medicare HMO | Admitting: Physician Assistant

## 2021-07-21 ENCOUNTER — Other Ambulatory Visit: Payer: Self-pay

## 2021-07-21 VITALS — BP 120/82 | HR 97 | Ht 71.0 in | Wt 308.0 lb

## 2021-07-21 DIAGNOSIS — I5022 Chronic systolic (congestive) heart failure: Secondary | ICD-10-CM | POA: Diagnosis not present

## 2021-07-21 DIAGNOSIS — Z95810 Presence of automatic (implantable) cardiac defibrillator: Secondary | ICD-10-CM

## 2021-07-21 DIAGNOSIS — I255 Ischemic cardiomyopathy: Secondary | ICD-10-CM | POA: Diagnosis not present

## 2021-07-21 DIAGNOSIS — I429 Cardiomyopathy, unspecified: Secondary | ICD-10-CM | POA: Diagnosis not present

## 2021-07-21 DIAGNOSIS — I2510 Atherosclerotic heart disease of native coronary artery without angina pectoris: Secondary | ICD-10-CM

## 2021-07-21 LAB — CUP PACEART INCLINIC DEVICE CHECK
Date Time Interrogation Session: 20230224180028
Implantable Lead Implant Date: 20180711
Implantable Lead Location: 753860
Implantable Lead Model: 413997
Implantable Lead Serial Number: 49848420
Implantable Pulse Generator Implant Date: 20130924
Lead Channel Pacing Threshold Amplitude: 0.3 V
Lead Channel Pacing Threshold Pulse Width: 0.4 ms
Lead Channel Sensing Intrinsic Amplitude: 6.4 mV
Pulse Gen Serial Number: 60669836

## 2021-07-21 NOTE — Patient Instructions (Signed)

## 2021-07-21 NOTE — Progress Notes (Signed)
Cardiology Office Note Date:  07/21/2021  Patient ID:  Austin [name], Austin [name] October 28, 1960, MRN [id number] PCP:  Nicoletta Dress, MD  Cardiologist:  Dr. Geraldo Pitter Electrophysiologist: Dr. Curt Bears    Chief Complaint: over due annual  History of Present Illness: Austin [name] is a 61 y.o. male with history of CAD (01/2001 with PCI to the RCA with reocclusion of the RCA 02/2001 and moderate residual disease of Lcx), chronic CHF (systolic), HTN, HLD, morbid obesity, DM   He comes in today to be seen for Dr. Curt Bears, last seen by him Oct 2021, referred for management of his ICD, noted appropriate tx Feb 2019 for VF, meds adjusted to continue to optimized GDMT  He sw Dr. Geraldo Pitter Jan 2023, discussed lifesytole modification, no medical changes were made.  Pre-op pool/Dr. Geraldo Pitter have addressed colonoscopy,  recommended patient stay on Aspirin perioperatively given patient's history of CAD with prior stents unless performing physician feels that procedure cannot be done on Aspirin. If procedure can absolutely not be done while on Aspirin, could hold for 5-7 days beforehand and then restart as soon as safely possible afterwards.  TODAY He is accompanied by his wife, doing "fine" His colonoscopy is a Q 5 year procedure.  He denies any CP, palpitations or cardiac awareness No SOB, near syncope or syncope Edema waxes and wanes  No bleeding or signs of bleeding  Device information [employer] single chamber ICD implanted 02/19/2012  RV lead failure (w/inappropriate shock), new RV lead placed 12/05/2016, old lead capped and abandoned. 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 61 year old male with chronic low back and bilateral leg pain that appears to correspond to L4 distribution.  Today we will request recently completed plain films of the lumbar spine for review.  Today recommend increasing gabapentin to 400 mg t.i.d., and will provide him with new prescription for this.  He may    Chronic GERD 12/17/2016   Chronic gouty arthritis 10/06/2020   Chronic obstructive pulmonary disease ([place]) 123XX123   Chronic systolic congestive heart failure ([place]) 12/17/2016   Controlled diabetes mellitus with stage 2 chronic kidney disease ([place]) 12/17/2016   Coronary artery disease 07/23/2018   cardiac stent x 2  Formatting of this note might be different from the original. cardiac stent x 2   COVID-19 virus infection 05/26/2019   Diabetes mellitus without complication ([place])    takes insulin   Elevated LFTs 12/14/2020   Essential hypertension 10/06/2020   Gout    Heart failure ([place]) 10/06/2020   Hyperglycemia due to type 2 diabetes mellitus ([place]) 10/06/2020   Hyperlipemia    Hypertension    Hypothyroidism 10/06/2020   ICD (implantable cardioverter-defibrillator), single, in situ 05/01/2016   Left upper quadrant pain 10/06/2020   Localized swelling, mass and lump, left upper limb 10/06/2020  Metabolic bone disease [id number]   Mixed hyperlipidemia 10/06/2020   Myocardial infarction [facility])    Osteoarthritis of knee 10/06/2020   Other vitamin B12 deficiency anemias 10/06/2020   Pneumonia 2013   Sciatica 10/06/2020   Seasonal allergic rhinitis 10/06/2020   Shortness of breath    with exertion    Simple chronic bronchitis (HCC)    Take advair inhaler   Testicular hypofunction 10/06/2020   Type 2 diabetes mellitus without complications ([place]) 123XX123   Vitamin D deficiency 03/26/2017    Past Surgical History:  Procedure Laterality Date   CARDIAC CATHETERIZATION     stent x 2   MULTIPLE EXTRACTIONS WITH ALVEOLOPLASTY N/A 03/16/2013   Procedure: MULTIPLE EXTRACTION (#9, 10, 11, 12, 13, 14, 15, 18, 20, 21, 22, 23, 24, 25, 26, 27) WITH ALVEOLOPLASTY;  Surgeon: Gae Bon, DDS;  Location: [place];  Service: Oral Surgery;  Laterality: N/A;   TOE SURGERY Right    pinky toe    Current Outpatient Medications  Medication Sig Dispense Refill   allopurinol (ZYLOPRIM) 300 MG tablet Take 300 mg by mouth daily.     aspirin EC 81 MG tablet Take 1 tablet (81 mg total) by mouth daily. 90 tablet 3   diclofenac (VOLTAREN) 75 MG EC tablet Take 75 mg by mouth 2 (two) times daily.     fluticasone (FLONASE) 50 MCG/ACT nasal spray Place 1 spray into both nostrils daily.     Fluticasone-Salmeterol (ADVAIR) 100-50 MCG/DOSE AEPB Inhale 1 puff into the lungs every 12 (twelve) hours.     furosemide (LASIX) 80 MG tablet Take 1 tablet (80 mg total) by mouth daily. 90 tablet 3   hydrALAZINE (APRESOLINE) 50 MG tablet Take 50 mg by mouth 3 (three) times daily.     levocetirizine (XYZAL) 5 MG tablet Take 5 mg by mouth daily.     metFORMIN (GLUCOPHAGE) 500 MG tablet Take 1 tablet (500 mg total) by mouth 2 (two) times daily with a meal. 60 tablet 1   metoprolol succinate (TOPROL-XL) 50 MG 24 hr tablet Take 50 mg by mouth daily.     sacubitril-valsartan (ENTRESTO) 24-26 MG Take 1 tablet by mouth 2 (two) times daily. 60 tablet 1   simvastatin (ZOCOR) 40 MG tablet Take 40 mg by mouth daily.     VENTOLIN HFA 108 (90 Base) MCG/ACT inhaler Inhale 1-2 puffs into the lungs every 4 (four) hours as needed for wheezing or shortness of breath.      Vitamin D, Ergocalciferol, (DRISDOL) 1.25 MG (50000 UNIT) CAPS capsule  Take 50,000 Units by mouth once a week.     No current facility-administered medications for this visit.    Allergies:   Patient has no known allergies.   Social History:  The patient  reports that he has quit smoking. He has never used smokeless tobacco. He reports current alcohol use of about 2.0 standard drinks per week. He reports that he does not use drugs.   Family History:  The patient's family history includes Heart attack in his father; Heart disease in his mother.  ROS:  Please see the history of present illness.    All other systems are reviewed and otherwise negative.   PHYSICAL EXAM:  VS:  There were no vitals taken for this visit. BMI: There is no height or weight on file to calculate BMI. Well nourished, well developed, in no acute distress HEENT: normocephalic, atraumatic Neck: no JVD, carotid bruits or masses Cardiac:  RRR; no significant murmurs, no  rubs, or gallops Lungs:  CTA b/l, no wheezing, rhonchi or rales Abd: soft, nontender MS: no deformity or atrophy Ext: trace-1+ edema b/l Skin: warm and dry, no rash Neuro:  No gross deficits appreciated Psych: euthymic mood, full affect  ICD site is stable, no tethering or discomfort   EKG:  Done today and reviewed by myself shows  SR, PVC 93bpm, QRS is 168ms  Device interrogation done today by industry and reviewed by myself:  Battery and lead measurements are good No arrhythmias   10/17/2020: TTE IMPRESSIONS   1. Left ventricular ejection fraction, by estimation, is 20 to 25%. The  left ventricle has severely decreased function. The left ventricle  demonstrates global hypokinesis. The left ventricular internal cavity size  was moderately dilated. There is mild  concentric left ventricular hypertrophy. Impaired relaxation with elevated  left atrial pressure. The average left ventricular global longitudinal  strain is -5.6 %. The global longitudinal strain is abnormal.   2. Right ventricular systolic  function is normal. The right ventricular  size is normal. There is normal pulmonary artery systolic pressure.   3. The mitral valve is normal in structure. No evidence of mitral valve  regurgitation. No evidence of mitral stenosis.   4. The aortic valve has an indeterminant number of cusps. Aortic valve  regurgitation is not visualized. Mild aortic valve sclerosis is present,  with no evidence of aortic valve stenosis.   5. Dilated adbdominal/descending aorta (2.4 cm).   6. The inferior vena cava is normal in size with greater than 50%  respiratory variability, suggesting right atrial pressure of 3 mmHg.    Recent Labs: 10/14/2020: TSH 3.680 04/24/2021: ALT 34; BNP 49.2; Hemoglobin 13.8; Platelets 241 05/01/2021: BUN 22; Creatinine, Ser 1.23; Potassium 3.9; Sodium 140  10/14/2020: Chol/HDL Ratio 2.6; Cholesterol, Total 205; HDL 79; LDL Chol Calc (NIH) 92; Triglycerides 208   CrCl cannot be calculated (Patient's most recent lab result is older than the maximum 21 days allowed.).   Wt Readings from Last 3 Encounters:  06/27/21 (!) 307 lb 9.6 oz (139.5 kg)  05/01/21 300 lb (136.1 kg)  04/24/21 (!) 303 lb 6.4 oz (137.6 kg)     Other studies reviewed: Additional studies/records reviewed today include: summarized above  ASSESSMENT AND PLAN:  ICD Intact function No particular device management required for a colonoscpy  Chronic CHF (systolic) ICM Some edema today, reports up/down No symptoms or exam findings of volume OL C/w Dr. Geraldo Pitter  CAD No symptoms of angina C/w Dr. Geraldo Pitter   Disposition: F/u with remotes as usual, in clinic with EP in a year, sooner if needed.  Current medicines are reviewed at length with the patient today.  The patient did not have any concerns regarding medicines.  Venetia Night, PA-C 07/21/2021 5:01 AM     [facility] [address] [place] [address] [phone] (office)  [phone] (fax)

## 2021-08-01 DIAGNOSIS — I509 Heart failure, unspecified: Secondary | ICD-10-CM | POA: Diagnosis not present

## 2021-08-01 DIAGNOSIS — J449 Chronic obstructive pulmonary disease, unspecified: Secondary | ICD-10-CM | POA: Diagnosis not present

## 2021-08-10 DIAGNOSIS — J449 Chronic obstructive pulmonary disease, unspecified: Secondary | ICD-10-CM | POA: Diagnosis not present

## 2021-08-10 DIAGNOSIS — M1712 Unilateral primary osteoarthritis, left knee: Secondary | ICD-10-CM | POA: Diagnosis not present

## 2021-08-10 DIAGNOSIS — M5440 Lumbago with sciatica, unspecified side: Secondary | ICD-10-CM | POA: Diagnosis not present

## 2021-08-10 DIAGNOSIS — I509 Heart failure, unspecified: Secondary | ICD-10-CM | POA: Diagnosis not present

## 2021-08-11 DIAGNOSIS — E119 Type 2 diabetes mellitus without complications: Secondary | ICD-10-CM | POA: Diagnosis not present

## 2021-08-11 DIAGNOSIS — E559 Vitamin D deficiency, unspecified: Secondary | ICD-10-CM | POA: Diagnosis not present

## 2021-08-11 DIAGNOSIS — I509 Heart failure, unspecified: Secondary | ICD-10-CM | POA: Diagnosis not present

## 2021-08-11 DIAGNOSIS — Z0000 Encounter for general adult medical examination without abnormal findings: Secondary | ICD-10-CM | POA: Diagnosis not present

## 2021-08-11 DIAGNOSIS — J449 Chronic obstructive pulmonary disease, unspecified: Secondary | ICD-10-CM | POA: Diagnosis not present

## 2021-08-11 DIAGNOSIS — D518 Other vitamin B12 deficiency anemias: Secondary | ICD-10-CM | POA: Diagnosis not present

## 2021-08-11 DIAGNOSIS — I4891 Unspecified atrial fibrillation: Secondary | ICD-10-CM | POA: Diagnosis not present

## 2021-08-11 DIAGNOSIS — E038 Other specified hypothyroidism: Secondary | ICD-10-CM | POA: Diagnosis not present

## 2021-08-11 DIAGNOSIS — E1165 Type 2 diabetes mellitus with hyperglycemia: Secondary | ICD-10-CM | POA: Diagnosis not present

## 2021-08-11 DIAGNOSIS — E782 Mixed hyperlipidemia: Secondary | ICD-10-CM | POA: Diagnosis not present

## 2021-08-11 DIAGNOSIS — I10 Essential (primary) hypertension: Secondary | ICD-10-CM | POA: Diagnosis not present

## 2021-09-01 DIAGNOSIS — J449 Chronic obstructive pulmonary disease, unspecified: Secondary | ICD-10-CM | POA: Diagnosis not present

## 2021-09-01 DIAGNOSIS — I509 Heart failure, unspecified: Secondary | ICD-10-CM | POA: Diagnosis not present

## 2021-09-04 ENCOUNTER — Ambulatory Visit (INDEPENDENT_AMBULATORY_CARE_PROVIDER_SITE_OTHER): Payer: Medicare HMO

## 2021-09-04 DIAGNOSIS — I255 Ischemic cardiomyopathy: Secondary | ICD-10-CM

## 2021-09-05 LAB — CUP PACEART REMOTE DEVICE CHECK
Date Time Interrogation Session: 20230410141602
Implantable Lead Implant Date: 20180711
Implantable Lead Location: 753860
Implantable Lead Model: 413997
Implantable Lead Serial Number: 49848420
Implantable Pulse Generator Implant Date: 20130924
Pulse Gen Serial Number: 60669836

## 2021-09-10 DIAGNOSIS — J449 Chronic obstructive pulmonary disease, unspecified: Secondary | ICD-10-CM | POA: Diagnosis not present

## 2021-09-10 DIAGNOSIS — M1712 Unilateral primary osteoarthritis, left knee: Secondary | ICD-10-CM | POA: Diagnosis not present

## 2021-09-10 DIAGNOSIS — M5440 Lumbago with sciatica, unspecified side: Secondary | ICD-10-CM | POA: Diagnosis not present

## 2021-09-10 DIAGNOSIS — I509 Heart failure, unspecified: Secondary | ICD-10-CM | POA: Diagnosis not present

## 2021-09-19 NOTE — Progress Notes (Signed)
Remote ICD transmission.   

## 2021-10-01 DIAGNOSIS — J449 Chronic obstructive pulmonary disease, unspecified: Secondary | ICD-10-CM | POA: Diagnosis not present

## 2021-10-01 DIAGNOSIS — I509 Heart failure, unspecified: Secondary | ICD-10-CM | POA: Diagnosis not present

## 2021-10-03 ENCOUNTER — Telehealth: Payer: Self-pay

## 2021-10-03 NOTE — Telephone Encounter (Signed)
No messages received for at least 21 days ?Last message received 23 days ago. The patient will be deactivated in 67 days. ? ?Sending to Device CMA to reestablish remote monitoring. ?

## 2021-10-06 NOTE — Telephone Encounter (Signed)
LMOVM for patient to call the device clinic. 

## 2021-10-09 NOTE — Telephone Encounter (Signed)
Pt agreed to call [employer] about his monitor. ?

## 2021-10-10 DIAGNOSIS — M5440 Lumbago with sciatica, unspecified side: Secondary | ICD-10-CM | POA: Diagnosis not present

## 2021-10-10 DIAGNOSIS — M1712 Unilateral primary osteoarthritis, left knee: Secondary | ICD-10-CM | POA: Diagnosis not present

## 2021-10-10 DIAGNOSIS — I509 Heart failure, unspecified: Secondary | ICD-10-CM | POA: Diagnosis not present

## 2021-10-10 DIAGNOSIS — J449 Chronic obstructive pulmonary disease, unspecified: Secondary | ICD-10-CM | POA: Diagnosis not present

## 2021-11-01 DIAGNOSIS — J449 Chronic obstructive pulmonary disease, unspecified: Secondary | ICD-10-CM | POA: Diagnosis not present

## 2021-11-01 DIAGNOSIS — I509 Heart failure, unspecified: Secondary | ICD-10-CM | POA: Diagnosis not present

## 2021-11-09 ENCOUNTER — Telehealth: Payer: Self-pay

## 2021-11-09 NOTE — Telephone Encounter (Signed)
I called the patient and [employer]. The patient needed a new cord for his monitor.

## 2021-12-04 ENCOUNTER — Ambulatory Visit (INDEPENDENT_AMBULATORY_CARE_PROVIDER_SITE_OTHER): Payer: 59

## 2021-12-04 DIAGNOSIS — I255 Ischemic cardiomyopathy: Secondary | ICD-10-CM | POA: Diagnosis not present

## 2021-12-04 LAB — CUP PACEART REMOTE DEVICE CHECK
Date Time Interrogation Session: 20230710084900
Implantable Lead Implant Date: 20180711
Implantable Lead Location: 753860
Implantable Lead Model: 413997
Implantable Lead Serial Number: 49848420
Implantable Pulse Generator Implant Date: 20130924
Pulse Gen Serial Number: 60669836

## 2021-12-21 ENCOUNTER — Ambulatory Visit: Payer: Medicare HMO | Admitting: Cardiology

## 2022-01-02 ENCOUNTER — Encounter: Payer: Self-pay | Admitting: Cardiology

## 2022-01-02 ENCOUNTER — Ambulatory Visit (INDEPENDENT_AMBULATORY_CARE_PROVIDER_SITE_OTHER): Payer: 59 | Admitting: Cardiology

## 2022-01-02 VITALS — BP 136/78 | HR 93 | Ht 71.0 in | Wt 307.6 lb

## 2022-01-02 DIAGNOSIS — I10 Essential (primary) hypertension: Secondary | ICD-10-CM

## 2022-01-02 DIAGNOSIS — I2510 Atherosclerotic heart disease of native coronary artery without angina pectoris: Secondary | ICD-10-CM

## 2022-01-02 DIAGNOSIS — Z95810 Presence of automatic (implantable) cardiac defibrillator: Secondary | ICD-10-CM

## 2022-01-02 DIAGNOSIS — E782 Mixed hyperlipidemia: Secondary | ICD-10-CM | POA: Diagnosis not present

## 2022-01-02 DIAGNOSIS — I429 Cardiomyopathy, unspecified: Secondary | ICD-10-CM | POA: Diagnosis not present

## 2022-01-02 NOTE — Progress Notes (Signed)
Cardiology Office Note:    Date:  01/02/2022   ID:  Austin [name], DOB 1960-09-10, MRN [id number]  PCP:  Nicoletta Dress, MD  Cardiologist:  Jenean Lindau, MD   Referring MD: Nicoletta Dress, MD    ASSESSMENT:    1. Atherosclerosis of native coronary artery of native heart without angina pectoris   2. Mixed hyperlipidemia   3. Essential hypertension   4. Coronary artery disease involving native coronary artery of native heart without angina pectoris   5. Cardiomyopathy, unspecified type ([place])   6. ICD (implantable cardioverter-defibrillator), single, in situ    PLAN:    In order of problems listed above:  Coronary artery disease: Secondary prevention stressed with the patient.  Importance of compliance with diet and medication stressed any vocalized understanding.  His blood work is followed by primary care.  He was advised to ambulate on a regular basis to the best of his ability. Ischemic cardiomyopathy: On guideline directed medical therapy and we will do a follow-up echocardiogram.  He has missed appointments in the past and I emphasized the importance of keeping appointments with him. Essential hypertension: Blood pressure stable and diet was emphasized. Mixed dyslipidemia, diabetes mellitus and obesity: Lifestyle modification urged diet emphasized.  Importance of regular exercise stressed weight reduction stressed and he promises to do better.  Risks of obesity explained. Intracardiac defibrillator: Managed by electrophysiology colleagues. Patient will be seen in follow-up appointment in 6 months or earlier if the patient has any concerns.  His blood work is managed by primary care.   Medication Adjustments/Labs and Tests Ordered: Current medicines are reviewed at length with the patient today.  Concerns regarding medicines are outlined above.  Orders Placed This Encounter  Procedures   ECHOCARDIOGRAM COMPLETE   No orders of the defined types were placed in this  encounter.    Chief Complaint  Patient presents with   Follow-up     History of Present Illness:    Austin [name] is a 61 y.o. male.  Patient has past medical history of coronary artery disease, ischemic cardiomyopathy, essential hypertension, dyslipidemia, diabetes mellitus and defibrillator in situ.  He denies any problems at this time.  He is morbidly obese.  He leads a sedentary lifestyle.  At the time of my evaluation, the patient is alert awake oriented and in no distress.  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 61 year old male with chronic low back and bilateral leg pain that appears to correspond to L4 distribution.  Today we will request recently completed plain films of the lumbar spine for review.  Today recommend increasing gabapentin to 400 mg t.i.d., and will provide him with new prescription for this.  He may    Chronic GERD 12/17/2016   Chronic gouty arthritis 10/06/2020   Chronic obstructive pulmonary disease ([place]) 123XX123   Chronic systolic congestive heart failure ([place]) 12/17/2016  Controlled diabetes mellitus with stage 2 chronic kidney disease (HCC) 12/17/2016   Coronary artery disease 07/23/2018   cardiac stent x 2  Formatting of this note might be different from the original. cardiac stent x 2   COVID-19 virus  infection 05/26/2019   Diabetes mellitus without complication (HCC)    takes insulin   Elevated LFTs 12/14/2020   Essential hypertension 10/06/2020   Gout    Heart failure (HCC) 10/06/2020   Hyperglycemia due to type 2 diabetes mellitus (HCC) 10/06/2020   Hyperlipemia    Hypertension    Hypothyroidism 10/06/2020   ICD (implantable cardioverter-defibrillator), single, in situ 05/01/2016   Left upper quadrant pain 10/06/2020   Localized swelling, mass and lump, left upper limb 10/06/2020   Metabolic bone disease 03/26/2017   Mixed hyperlipidemia 10/06/2020   Myocardial infarction [facility])    Osteoarthritis of knee 10/06/2020   Other vitamin B12 deficiency anemias 10/06/2020   Pneumonia 2013   Sciatica 10/06/2020   Seasonal allergic rhinitis 10/06/2020   Shortness of breath    with exertion   Simple chronic bronchitis (HCC)    Take advair inhaler   Testicular hypofunction 10/06/2020   Type 2 diabetes mellitus without complications (HCC) 10/06/2020   Vitamin D deficiency 03/26/2017    Past Surgical History:  Procedure Laterality Date   CARDIAC CATHETERIZATION     stent x 2   MULTIPLE EXTRACTIONS WITH ALVEOLOPLASTY N/A 03/16/2013   Procedure: MULTIPLE EXTRACTION (#9, 10, 11, 12, 13, 14, 15, 18, 20, 21, 22, 23, 24, 25, 26, 27) WITH ALVEOLOPLASTY;  Surgeon: Georgia Lopes, DDS;  Location: [facility];  Service: Oral Surgery;  Laterality: N/A;   TOE SURGERY Right    pinky toe    Current Medications: Current Meds  Medication Sig   allopurinol (ZYLOPRIM) 300 MG tablet Take 300 mg by mouth daily.   aspirin EC 81 MG tablet Take 1 tablet (81 mg total) by mouth daily.   diclofenac (VOLTAREN) 75 MG EC tablet Take 75 mg by mouth 2 (two) times daily.   fluticasone (FLONASE) 50 MCG/ACT nasal spray Place 1 spray into both nostrils daily.   Fluticasone-Salmeterol (ADVAIR) 100-50 MCG/DOSE AEPB Inhale 1 puff into the lungs every 12 (twelve) hours.   furosemide (LASIX) 80 MG tablet Take 1 tablet (80  mg total) by mouth daily.   hydrALAZINE (APRESOLINE) 50 MG tablet Take 50 mg by mouth 3 (three) times daily.   levocetirizine (XYZAL) 5 MG tablet Take 5 mg by mouth daily.   metFORMIN (GLUCOPHAGE) 500 MG tablet Take 1 tablet (500 mg total) by mouth 2 (two) times daily with a meal.   metoprolol succinate (TOPROL-XL) 50 MG 24 hr tablet Take 50 mg by mouth daily.   sacubitril-valsartan (ENTRESTO) 24-26 MG Take 1 tablet by mouth 2 (two) times daily.   simvastatin (ZOCOR) 40 MG tablet Take 40 mg by mouth daily.   VENTOLIN HFA 108 (90 Base) MCG/ACT inhaler Inhale 1-2 puffs into the lungs every 4 (four) hours as needed for wheezing or shortness of breath.    Vitamin D, Ergocalciferol, (DRISDOL) 1.25 MG (50000 UNIT) CAPS capsule Take 50,000 Units by mouth once a week.     Allergies:   Patient has no known allergies.   Social History   Socioeconomic History   Marital status: Single    Spouse name: Not on file   Number of children: Not on file   Years of education: Not on file   Highest education level: Not on file  Occupational History   Occupation: unemployed  Tobacco Use   Smoking status: Former   Smokeless tobacco: Never  Building services engineer Use: Unknown  Substance and Sexual Activity   Alcohol use: Yes    Alcohol/week: 2.0 standard drinks of alcohol    Types: 2 Cans of beer per week    Comment: occasional   Drug use: No   Sexual activity: Not on file  Other Topics Concern   Not on file  Social History Narrative   Not on file   Social Determinants of Health   Financial Resource Strain: Not on file  Food Insecurity: Not on file  Transportation Needs: Not on file  Physical Activity: Not on file  Stress: Not on file  Social Connections: Not on file     Family History: The patient's family history includes Heart attack in his father; Heart disease in his mother.  ROS:   Please see the history of present illness.    All other systems reviewed and are  negative.  EKGs/Labs/Other Studies Reviewed:    The following studies were reviewed today: I discussed my findings with the patient at length   Recent Labs: 04/24/2021: ALT 34; BNP 49.2; Hemoglobin 13.8; Platelets 241 05/01/2021: BUN 22; Creatinine, Ser 1.23; Potassium 3.9; Sodium 140  Recent Lipid Panel    Component Value Date/Time   CHOL 205 (H) 10/14/2020 0856   TRIG 208 (H) 10/14/2020 0856   HDL 79 10/14/2020 0856   CHOLHDL 2.6 10/14/2020 0856   LDLCALC 92 10/14/2020 0856    Physical Exam:    VS:  BP 136/78 (BP Location: Right Arm, Patient Position: Sitting)   Pulse 93   Ht 5\' 11"  (1.803 m)   Wt (!) 307 lb 9.6 oz (139.5 kg)   SpO2 96%   BMI 42.90 kg/m     Wt Readings from Last 3 Encounters:  01/02/22 (!) 307 lb 9.6 oz (139.5 kg)  07/21/21 (!) 308 lb (139.7 kg)  06/27/21 (!) 307 lb 9.6 oz (139.5 kg)     GEN: Patient is in no acute distress HEENT: Normal NECK: No JVD; No carotid bruits LYMPHATICS: No lymphadenopathy CARDIAC: Hear sounds regular, 2/6 systolic murmur at the apex. RESPIRATORY:  Clear to auscultation without rales, wheezing or rhonchi  ABDOMEN: Soft, non-tender, non-distended MUSCULOSKELETAL:  No edema; No deformity  SKIN: Warm and dry NEUROLOGIC:  Alert and oriented x 3 PSYCHIATRIC:  Normal affect   Signed, 06/29/21, MD  01/02/2022 4:19 PM    [facility]

## 2022-01-02 NOTE — Patient Instructions (Signed)
Medication Instructions:  Your physician recommends that you continue on your current medications as directed. Please refer to the Current Medication list given to you today.  *If you need a refill on your cardiac medications before your next appointment, please call your pharmacy*   Lab Work: NONE If you have labs (blood work) drawn today and your tests are completely normal, you will receive your results only by: MyChart Message (if you have MyChart) OR A paper copy in the mail If you have any lab test that is abnormal or we need to change your treatment, we will call you to review the results.   Testing/Procedures: Your physician has requested that you have an echocardiogram. Echocardiography is a painless test that uses sound waves to create images of your heart. It provides your doctor with information about the size and shape of your heart and how well your heart's chambers and valves are working. This procedure takes approximately one hour. There are no restrictions for this procedure.    Follow-Up: At [facility], you and your health needs are our priority.  As part of our continuing mission to provide you with exceptional heart care, we have created designated Provider Care Teams.  These Care Teams include your primary Cardiologist (physician) and Advanced Practice Providers (APPs -  Physician Assistants and Nurse Practitioners) who all work together to provide you with the care you need, when you need it.  We recommend signing up for the patient portal called "MyChart".  Sign up information is provided on this After Visit Summary.  MyChart is used to connect with patients for Virtual Visits (Telemedicine).  Patients are able to view lab/test results, encounter notes, upcoming appointments, etc.  Non-urgent messages can be sent to your provider as well.   To learn more about what you can do with MyChart, go to [email or web address].    Your next appointment:   9 month(s)  The  format for your next appointment:   In Person  Provider:   Belva Crome, MD    Other Instructions   Important Information About Sugar

## 2022-01-04 NOTE — Progress Notes (Signed)
Remote ICD transmission.   

## 2022-01-09 ENCOUNTER — Ambulatory Visit (INDEPENDENT_AMBULATORY_CARE_PROVIDER_SITE_OTHER): Payer: 59

## 2022-01-09 DIAGNOSIS — E782 Mixed hyperlipidemia: Secondary | ICD-10-CM | POA: Diagnosis not present

## 2022-01-09 DIAGNOSIS — I10 Essential (primary) hypertension: Secondary | ICD-10-CM | POA: Diagnosis not present

## 2022-01-09 DIAGNOSIS — I2510 Atherosclerotic heart disease of native coronary artery without angina pectoris: Secondary | ICD-10-CM

## 2022-01-09 LAB — ECHOCARDIOGRAM COMPLETE
Area-P 1/2: 6.83 cm2
S' Lateral: 5.9 cm

## 2022-01-25 ENCOUNTER — Telehealth: Payer: Self-pay | Admitting: Cardiology

## 2022-01-25 DIAGNOSIS — I255 Ischemic cardiomyopathy: Secondary | ICD-10-CM

## 2022-01-25 NOTE — Telephone Encounter (Signed)
Lvm to callback.  Pt needs a nurse visit in the next few days for vital sign check and pulse blood pressure check and we may consider doubling Entresto. No EKG needed.

## 2022-01-25 NOTE — Telephone Encounter (Signed)
Left VM for pt to call back.

## 2022-01-25 NOTE — Telephone Encounter (Signed)
  Pt said, he need to schedule a nurse a visit to take his vital signs and ekg

## 2022-02-01 NOTE — Telephone Encounter (Signed)
Spoke with pt and advised that he needs a nurse visit. Appt made. Pt verbalized understanding and had no additional questions.

## 2022-02-01 NOTE — Telephone Encounter (Signed)
Left VM for pt to call back.

## 2022-02-01 NOTE — Addendum Note (Signed)
Addended by: Eleonore Chiquito on: 02/01/2022 11:48 AM   Modules accepted: Orders

## 2022-02-05 ENCOUNTER — Telehealth: Payer: Self-pay | Admitting: Cardiology

## 2022-02-05 ENCOUNTER — Ambulatory Visit: Payer: 59

## 2022-02-05 NOTE — Telephone Encounter (Signed)
Appointment had been cancelled. Left vm for pt to callback.

## 2022-02-05 NOTE — Telephone Encounter (Signed)
Pt called needing to reschedule his nurses visit today.

## 2022-03-05 ENCOUNTER — Ambulatory Visit (INDEPENDENT_AMBULATORY_CARE_PROVIDER_SITE_OTHER): Payer: 59

## 2022-03-05 DIAGNOSIS — I255 Ischemic cardiomyopathy: Secondary | ICD-10-CM

## 2022-03-05 DIAGNOSIS — I5022 Chronic systolic (congestive) heart failure: Secondary | ICD-10-CM

## 2022-03-06 LAB — CUP PACEART REMOTE DEVICE CHECK
Date Time Interrogation Session: 20231009145509
Implantable Lead Implant Date: 20180711
Implantable Lead Location: 753860
Implantable Lead Model: 413997
Implantable Lead Serial Number: 49848420
Implantable Pulse Generator Implant Date: 20130924
Pulse Gen Serial Number: 60669836

## 2022-03-16 NOTE — Progress Notes (Signed)
Remote ICD transmission.   

## 2022-03-29 ENCOUNTER — Telehealth: Payer: Self-pay

## 2022-03-29 NOTE — Telephone Encounter (Signed)
No messages received for at least 21 days Last message received 22 days ago. The patient will be deactivated in 68 days. 

## 2022-03-30 NOTE — Telephone Encounter (Signed)
I called the patient and [employer]. [employer] is sending him a new power cord. If that does not work, they will send him a new monitor.

## 2022-04-06 NOTE — Telephone Encounter (Signed)
LMOVM for patient to give [place] a call back. Just following up to see if he received his new power cord to his monitor.

## 2022-04-09 NOTE — Telephone Encounter (Signed)
LMVOM for patient to return my call. Just following up to see if the patient got his new power cord.

## 2022-04-13 NOTE — Telephone Encounter (Signed)
Pt monitor updated 04/13/2022.

## 2022-06-04 ENCOUNTER — Ambulatory Visit (INDEPENDENT_AMBULATORY_CARE_PROVIDER_SITE_OTHER): Payer: 59

## 2022-06-04 DIAGNOSIS — I255 Ischemic cardiomyopathy: Secondary | ICD-10-CM | POA: Diagnosis not present

## 2022-06-04 DIAGNOSIS — I5022 Chronic systolic (congestive) heart failure: Secondary | ICD-10-CM

## 2022-06-05 LAB — CUP PACEART REMOTE DEVICE CHECK
Battery Voltage: 21
Date Time Interrogation Session: 20240108101332
Implantable Lead Connection Status: 753985
Implantable Lead Implant Date: 20180711
Implantable Lead Location: 753860
Implantable Lead Model: 413997
Implantable Lead Serial Number: 49848420
Implantable Pulse Generator Implant Date: 20130924
Pulse Gen Serial Number: 60669836

## 2022-07-05 NOTE — Progress Notes (Signed)
Remote ICD transmission.   

## 2022-07-11 ENCOUNTER — Encounter: Payer: Self-pay | Admitting: Podiatry

## 2022-07-11 ENCOUNTER — Ambulatory Visit (INDEPENDENT_AMBULATORY_CARE_PROVIDER_SITE_OTHER): Payer: 59 | Admitting: Podiatry

## 2022-07-11 DIAGNOSIS — E119 Type 2 diabetes mellitus without complications: Secondary | ICD-10-CM

## 2022-07-11 DIAGNOSIS — M79674 Pain in right toe(s): Secondary | ICD-10-CM

## 2022-07-11 DIAGNOSIS — B351 Tinea unguium: Secondary | ICD-10-CM | POA: Diagnosis not present

## 2022-07-11 DIAGNOSIS — M79675 Pain in left toe(s): Secondary | ICD-10-CM | POA: Diagnosis not present

## 2022-07-11 DIAGNOSIS — E1142 Type 2 diabetes mellitus with diabetic polyneuropathy: Secondary | ICD-10-CM | POA: Diagnosis not present

## 2022-07-11 NOTE — Progress Notes (Signed)
Subjective:  Patient ID: Austin [name], male    DOB: Mar 09, 1961,   MRN: [id number]  Chief Complaint  Patient presents with   Foot Pain    Bilateral foot numbness and swelling. No injuries. Patient is a diabetic and latest A1C 7.0.     Nail Problem    Diabetic Foot Care     62 y.o. male presents for concern of thickened elongated and painful nails that are difficult to trim. Requesting to have them trimmed today. Relates burning and tingling in their feet. Patient is diabetic and last A1c was  Lab Results  Component Value Date   HGBA1C 7.7 (H) 05/27/2019   .   PCP:  Nicoletta Dress, MD    . Denies any other pedal complaints. Denies n/v/f/c.   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 62 year old male with chronic low back and bilateral leg pain that appears to correspond to L4 distribution.  Today we will request recently completed plain films of the lumbar spine for review.  Today recommend increasing gabapentin to 400 mg t.i.d., and will provide him with new prescription for this.  He may    Chronic GERD 12/17/2016   Chronic gouty arthritis 10/06/2020   Chronic obstructive pulmonary disease ([place]) 123XX123   Chronic systolic congestive heart failure ([place]) 12/17/2016   Controlled diabetes mellitus with stage 2 chronic kidney disease ([place])  12/17/2016   Coronary artery disease 07/23/2018   cardiac stent x 2  Formatting of this note might be different from the original. cardiac stent x 2   COVID-19 virus infection 05/26/2019   Diabetes mellitus without complication ([place])    takes insulin   Elevated LFTs 12/14/2020   Essential hypertension 10/06/2020   Gout    Heart failure ([place]) 10/06/2020   Hyperglycemia due to type 2 diabetes mellitus ([place]) 10/06/2020   Hyperlipemia    Hypertension    Hypothyroidism 10/06/2020   ICD (implantable cardioverter-defibrillator), single, in situ 05/01/2016   Left upper quadrant pain 10/06/2020   Localized swelling, mass and lump, left upper limb 123XX123   Metabolic bone disease [id number]   Mixed hyperlipidemia 10/06/2020   Myocardial infarction [facility])    Osteoarthritis of knee 10/06/2020   Other vitamin B12 deficiency anemias 10/06/2020   Pneumonia 2013   Sciatica 10/06/2020   Seasonal allergic rhinitis 10/06/2020   Shortness of breath    with exertion   Simple chronic bronchitis (HCC)    Take advair inhaler   Testicular hypofunction 10/06/2020   Type 2 diabetes mellitus without complications ([place]) 123XX123   Vitamin D deficiency 03/26/2017    Objective:  Physical Exam: Vascular: DP/PT pulses 2/4 bilateral. CFT <3 seconds. Absent hair growth on digits. Edema noted to bilateral lower extremities. Xerosis noted bilaterally.  Skin. No lacerations or abrasions bilateral feet. Nails 1-5 bilateral  are thickened discolored and elongated with subungual debris.  Musculoskeletal: MMT 5/5 bilateral lower  extremities in DF, PF, Inversion and Eversion. Deceased ROM in DF of ankle joint.  Neurological: Sensation intact to light touch. Protective sensation diminished bilateral.    Assessment:   1. Pain due to onychomycosis of toenails of both feet   2. Diabetes mellitus without complication ([place])   3. Diabetic polyneuropathy associated with type 2 diabetes mellitus ([place])      Plan:   Patient was evaluated and treated and all questions answered. -Discussed and educated patient on diabetic foot care, especially with  regards to the vascular, neurological and musculoskeletal systems.  -Stressed the importance of good glycemic control and the detriment of not  controlling glucose levels in relation to the foot. -Discussed supportive shoes at all times and checking feet regularly.  -Mechanically debrided all nails 1-5 bilateral using sterile nail nipper and filed with dremel without incident  -Answered all patient questions -Patient to return  in 3 months for at risk foot care -Patient advised to call the office if any problems or questions arise in the meantime.   Lorenda Peck, DPM

## 2022-09-03 ENCOUNTER — Ambulatory Visit (INDEPENDENT_AMBULATORY_CARE_PROVIDER_SITE_OTHER): Payer: 59

## 2022-09-03 DIAGNOSIS — I255 Ischemic cardiomyopathy: Secondary | ICD-10-CM

## 2022-09-04 LAB — CUP PACEART REMOTE DEVICE CHECK
Battery Voltage: 19
Date Time Interrogation Session: 20240409080638
Implantable Lead Connection Status: 753985
Implantable Lead Implant Date: 20180711
Implantable Lead Location: 753860
Implantable Lead Model: 413997
Implantable Lead Serial Number: 49848420
Implantable Pulse Generator Implant Date: 20130924
Pulse Gen Serial Number: 60669836

## 2022-10-08 NOTE — Progress Notes (Signed)
Remote ICD transmission.   

## 2022-10-09 ENCOUNTER — Ambulatory Visit (INDEPENDENT_AMBULATORY_CARE_PROVIDER_SITE_OTHER): Payer: 59 | Admitting: Podiatry

## 2022-10-09 DIAGNOSIS — M79675 Pain in left toe(s): Secondary | ICD-10-CM

## 2022-10-09 DIAGNOSIS — M79674 Pain in right toe(s): Secondary | ICD-10-CM

## 2022-10-09 DIAGNOSIS — B351 Tinea unguium: Secondary | ICD-10-CM

## 2022-10-09 DIAGNOSIS — E1142 Type 2 diabetes mellitus with diabetic polyneuropathy: Secondary | ICD-10-CM

## 2022-10-09 NOTE — Progress Notes (Signed)
  Subjective:  Patient ID: Austin [name], male    DOB: 1960/07/23,  MRN: [id number]  RFC   62 y.o. male presents with the above complaint. History confirmed with patient. Patient presenting with pain related to dystrophic thickened elongated nails. Patient is unable to trim own nails related to nail dystrophy and/or mobility issues. Patient does have a history of T2DM.   Objective:  Physical Exam: warm, good capillary refill nail exam onychomycosis of the toenails, onycholysis, and dystrophic nails DP pulses palpable, PT pulses palpable, and protective sensation absent Left Foot:  Pain with palpation of nails due to elongation and dystrophic growth.  Right Foot: Pain with palpation of nails due to elongation and dystrophic growth.   Assessment:   1. Pain due to onychomycosis of toenails of both feet   2. Diabetic polyneuropathy associated with type 2 diabetes mellitus (HCC)      Plan:  Patient was evaluated and treated and all questions answered.    #Onychomycosis with pain  -Nails palliatively debrided as below. -Educated on self-care  Procedure: Nail Debridement Rationale: Pain Type of Debridement: manual, sharp debridement. Instrumentation: Nail nipper, rotary burr. Number of Nails: 10  Return in about 3 months (around 01/09/2023) for [facility].         Corinna Gab, DPM [facility] / [facility]

## 2023-01-08 ENCOUNTER — Ambulatory Visit (INDEPENDENT_AMBULATORY_CARE_PROVIDER_SITE_OTHER): Payer: 59 | Admitting: Podiatry

## 2023-01-08 DIAGNOSIS — Z91199 Patient's noncompliance with other medical treatment and regimen due to unspecified reason: Secondary | ICD-10-CM

## 2023-01-08 NOTE — Progress Notes (Signed)
No show for apt.

## 2023-01-22 ENCOUNTER — Ambulatory Visit: Payer: 59 | Admitting: Podiatry

## 2023-03-26 ENCOUNTER — Ambulatory Visit (INDEPENDENT_AMBULATORY_CARE_PROVIDER_SITE_OTHER): Payer: 59 | Admitting: Podiatry

## 2023-03-26 DIAGNOSIS — Z91199 Patient's noncompliance with other medical treatment and regimen due to unspecified reason: Secondary | ICD-10-CM

## 2023-03-26 NOTE — Progress Notes (Signed)
 Patient absent for apointment

## 2023-03-27 DIAGNOSIS — Z6841 Body Mass Index (BMI) 40.0 and over, adult: Secondary | ICD-10-CM | POA: Insufficient documentation

## 2023-03-27 HISTORY — DX: Body Mass Index (BMI) 40.0 and over, adult: Z684

## 2023-03-28 ENCOUNTER — Ambulatory Visit: Payer: 59 | Attending: Cardiology | Admitting: Cardiology

## 2023-03-28 ENCOUNTER — Encounter: Payer: Self-pay | Admitting: Cardiology

## 2023-03-28 VITALS — BP 122/88 | HR 90 | Ht 71.0 in | Wt 300.2 lb

## 2023-03-28 DIAGNOSIS — E119 Type 2 diabetes mellitus without complications: Secondary | ICD-10-CM | POA: Diagnosis not present

## 2023-03-28 DIAGNOSIS — I5022 Chronic systolic (congestive) heart failure: Secondary | ICD-10-CM | POA: Diagnosis not present

## 2023-03-28 DIAGNOSIS — I10 Essential (primary) hypertension: Secondary | ICD-10-CM

## 2023-03-28 DIAGNOSIS — I2510 Atherosclerotic heart disease of native coronary artery without angina pectoris: Secondary | ICD-10-CM

## 2023-03-28 DIAGNOSIS — Z95810 Presence of automatic (implantable) cardiac defibrillator: Secondary | ICD-10-CM

## 2023-03-28 NOTE — Patient Instructions (Signed)

## 2023-03-28 NOTE — Progress Notes (Signed)
Cardiology Office Note:    Date:  03/28/2023   ID:  Austin [name], DOB 1961-04-04, MRN [id number]  PCP:  Alveria Apley, NP  Cardiologist:  Garwin Brothers, MD   Referring MD: Paulina Fusi, MD    ASSESSMENT:    1. Essential hypertension   2. Chronic systolic congestive heart failure (HCC)   3. Coronary artery disease involving native coronary artery of native heart without angina pectoris   4. Diabetes mellitus without complication (HCC)   5. Automatic implantable cardioverter-defibrillator in situ   6. Morbid obesity (HCC)    PLAN:    In order of problems listed above:  Coronary artery disease: Secondary prevention stressed with the patient.  Importance of compliance with diet medication stressed any vocalized understanding. Essential hypertension: Blood pressure stable and diet was emphasized. Cardiomyopathy with severely depressed ejection fraction: Patient is on guideline directed medical therapy.  Blood pressure is borderline.  His blood work is followed by primary care and it was done a few weeks ago. Mixed sleep, diabetes mellitus and morbid obesity: Weight reduction stressed diet emphasized.  Risks of obesity explained and he promises to do better.  Diet emphasized. Patient will be seen in follow-up appointment in 9 months or earlier if the patient has any concerns.    Medication Adjustments/Labs and Tests Ordered: Current medicines are reviewed at length with the patient today.  Concerns regarding medicines are outlined above.  Orders Placed This Encounter  Procedures   EKG 12-Lead   No orders of the defined types were placed in this encounter.    Chief Complaint  Patient presents with   Follow-up     History of Present Illness:    Austin [name] is a 62 y.o. male.  Patient has past medical history of coronary artery disease, essential hypertension, mixed dyslipidemia and cardiomyopathy.  He has a defibrillator.  He denies any problems at this  time and takes care of activities of daily living.  No chest pain orthopnea or PND.  At the time of my evaluation, the patient is alert awake oriented and in no distress.  He tells me that he weighs himself on a regular basis.  He has history of congestive heart failure with severely depressed ejection fraction.  Past Medical History:  Diagnosis Date   Absence of toe (HCC) 06/27/2021   Acute respiratory failure with hypoxia (HCC) 05/27/2019   AKI (acute kidney injury) (HCC) 12/17/2016   Asthma 07/23/2018   Atherosclerotic heart disease of native coronary artery without angina pectoris 10/06/2020   Atrial fibrillation (HCC) 06/27/2021   Automatic implantable cardioverter-defibrillator in situ    Benign hypertension with chronic kidney disease, stage II 12/17/2016   Body mass index (BMI) 45.0-49.9, adult (HCC) 03/27/2023   Cardiac pacemaker in situ 10/06/2020   Cardiomyopathy (HCC) 05/01/2016   Cardiovascular symptoms 10/06/2020   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 62 year old male with chronic low back and bilateral leg pain that appears to correspond to L4 distribution.  Today we will request recently completed plain films of the lumbar spine for review.  Today recommend increasing gabapentin to 400 mg t.i.d., and will provide him with new prescription for this.  He may    Chronic GERD 12/17/2016   Chronic gouty arthritis 10/06/2020   Chronic obstructive pulmonary disease (HCC) 10/06/2020   Chronic systolic congestive  heart failure (HCC) 12/17/2016   Controlled diabetes mellitus with stage 2 chronic kidney disease (HCC) 12/17/2016   Coronary artery disease 07/23/2018   cardiac stent x 2  Formatting of this note might be different from the original. cardiac stent x 2   COVID-19 virus infection 05/26/2019   Diabetes mellitus without  complication (HCC)    takes insulin   Elevated LFTs 12/14/2020   Essential hypertension 10/06/2020   Gout    Heart failure (HCC) 10/06/2020   Hyperglycemia due to type 2 diabetes mellitus (HCC) 10/06/2020   Hyperlipemia    Hyperlipidemia 06/27/2021   Hypertension    Hypertensive heart failure (HCC) 10/06/2020   Hypothyroidism 10/06/2020   ICD (implantable cardioverter-defibrillator), single, in situ 05/01/2016   Left upper quadrant pain 10/06/2020   Localized swelling, mass and lump, left upper limb 10/06/2020   Metabolic bone disease 03/26/2017   Mixed hyperlipidemia 10/06/2020   Morbid obesity (HCC) 06/27/2021   Myocardial infarction (HCC)    Osteoarthritis of knee 10/06/2020   Other vitamin B12 deficiency anemias 10/06/2020   Pain in right knee 06/27/2021   Pneumonia 2013   Primary osteoarthritis 06/27/2021   Sciatica 10/06/2020   Seasonal allergic rhinitis 10/06/2020   Shortness of breath    with exertion   Simple chronic bronchitis (HCC)    Take advair inhaler   Testicular hypofunction 10/06/2020   Type 2 diabetes mellitus without complications (HCC) 10/06/2020   Vitamin D deficiency 03/26/2017    Past Surgical History:  Procedure Laterality Date   CARDIAC CATHETERIZATION     stent x 2   MULTIPLE EXTRACTIONS WITH ALVEOLOPLASTY N/A 03/16/2013   Procedure: MULTIPLE EXTRACTION (#9, 10, 11, 12, 13, 14, 15, 18, 20, 21, 22, 23, 24, 25, 26, 27) WITH ALVEOLOPLASTY;  Surgeon: Georgia Lopes, DDS;  Location: [facility] OR;  Service: Oral Surgery;  Laterality: N/A;   TOE SURGERY Right    pinky toe    Current Medications: Current Meds  Medication Sig   allopurinol (ZYLOPRIM) 300 MG tablet Take 300 mg by mouth daily.   aspirin EC 81 MG tablet Take 1 tablet (81 mg total) by mouth daily.   diclofenac (VOLTAREN) 75 MG EC tablet Take 75 mg by mouth 2 (two) times daily.   fluticasone (FLONASE) 50 MCG/ACT nasal spray Place 1 spray into both nostrils daily.   Fluticasone-Salmeterol  (ADVAIR) 100-50 MCG/DOSE AEPB Inhale 1 puff into the lungs every 12 (twelve) hours.   furosemide (LASIX) 80 MG tablet Take 1 tablet (80 mg total) by mouth daily.   hydrALAZINE (APRESOLINE) 50 MG tablet Take 50 mg by mouth 3 (three) times daily.   levocetirizine (XYZAL) 5 MG tablet Take 5 mg by mouth daily.   metFORMIN (GLUCOPHAGE) 500 MG tablet Take 1 tablet (500 mg total) by mouth 2 (two) times daily with a meal.   metoprolol succinate (TOPROL-XL) 50 MG 24 hr tablet Take 50 mg by mouth daily.   sacubitril-valsartan (ENTRESTO) 24-26 MG Take 1 tablet by mouth 2 (two) times daily.   simvastatin (ZOCOR) 40 MG tablet Take 40 mg by mouth daily.   VENTOLIN HFA 108 (90 Base) MCG/ACT inhaler Inhale 1-2 puffs into the lungs every 4 (four) hours as needed for wheezing or shortness of breath.    Vitamin D, Ergocalciferol, (DRISDOL) 1.25 MG (50000 UNIT) CAPS capsule Take 50,000 Units by mouth once a week.     Allergies:   Patient has no known allergies.   Social History   Socioeconomic History   Marital  status: Single    Spouse name: Not on file   Number of children: Not on file   Years of education: Not on file   Highest education level: Not on file  Occupational History   Occupation: unemployed  Tobacco Use   Smoking status: Former   Smokeless tobacco: Never  Vaping Use   Vaping status: Unknown  Substance and Sexual Activity   Alcohol use: Yes    Alcohol/week: 2.0 standard drinks of alcohol    Types: 2 Cans of beer per week    Comment: occasional   Drug use: No   Sexual activity: Not on file  Other Topics Concern   Not on file  Social History Narrative   Not on file   Social Determinants of Health   Financial Resource Strain: Not on file  Food Insecurity: Not on file  Transportation Needs: Not on file  Physical Activity: Not on file  Stress: Not on file  Social Connections: Not on file     Family History: The patient's family history includes Heart attack in his father;  Heart disease in his mother.  ROS:   Please see the history of present illness.    All other systems reviewed and are negative.  EKGs/Labs/Other Studies Reviewed:    The following studies were reviewed today: .Marland KitchenEKG Interpretation Date/Time:  Thursday March 28 2023 16:00:58 EDT Ventricular Rate:  90 PR Interval:  182 QRS Duration:  122 QT Interval:  384 QTC Calculation: 469 R Axis:   146  Text Interpretation: Sinus rhythm Right axis deviation Inferior infarct (cited on or before 30-Jan-2001) Anterior infarct , age undetermined Abnormal ECG When compared with ECG of 13-Mar-2013 10:23, Current undetermined rhythm precludes rhythm comparison, needs review QRS duration has increased Anterior infarct is now Present Nonspecific T wave abnormality now evident in Anterior leads Confirmed by Belva Crome [phone]) on 03/28/2023 4:13:47 PM     Recent Labs: No results found for requested labs within last 365 days.  Recent Lipid Panel    Component Value Date/Time   CHOL 205 (H) 10/14/2020 0856   TRIG 208 (H) 10/14/2020 0856   HDL 79 10/14/2020 0856   CHOLHDL 2.6 10/14/2020 0856   LDLCALC 92 10/14/2020 0856    Physical Exam:    VS:  BP 122/88 (BP Location: Left Arm, Patient Position: Sitting)   Pulse 90   Ht 5\' 11"  (1.803 m)   Wt (!) 300 lb 3.2 oz (136.2 kg)   SpO2 93%   BMI 41.87 kg/m     Wt Readings from Last 3 Encounters:  03/28/23 (!) 300 lb 3.2 oz (136.2 kg)  01/02/22 (!) 307 lb 9.6 oz (139.5 kg)  07/21/21 (!) 308 lb (139.7 kg)     GEN: Patient is in no acute distress HEENT: Normal NECK: No JVD; No carotid bruits LYMPHATICS: No lymphadenopathy CARDIAC: Hear sounds regular, 2/6 systolic murmur at the apex. RESPIRATORY:  Clear to auscultation without rales, wheezing or rhonchi  ABDOMEN: Soft, non-tender, non-distended MUSCULOSKELETAL:  No edema; No deformity  SKIN: Warm and dry NEUROLOGIC:  Alert and oriented x 3 PSYCHIATRIC:  Normal affect   Signed, Garwin Brothers, MD  03/28/2023 4:13 PM    [facility]

## 2023-07-01 ENCOUNTER — Encounter: Payer: Self-pay | Admitting: Podiatry

## 2023-07-01 ENCOUNTER — Ambulatory Visit (INDEPENDENT_AMBULATORY_CARE_PROVIDER_SITE_OTHER): Payer: 59 | Admitting: Podiatry

## 2023-07-01 DIAGNOSIS — E1142 Type 2 diabetes mellitus with diabetic polyneuropathy: Secondary | ICD-10-CM | POA: Diagnosis not present

## 2023-07-01 DIAGNOSIS — B353 Tinea pedis: Secondary | ICD-10-CM | POA: Diagnosis not present

## 2023-07-01 DIAGNOSIS — M79674 Pain in right toe(s): Secondary | ICD-10-CM | POA: Diagnosis not present

## 2023-07-01 DIAGNOSIS — B351 Tinea unguium: Secondary | ICD-10-CM | POA: Diagnosis not present

## 2023-07-01 DIAGNOSIS — M79675 Pain in left toe(s): Secondary | ICD-10-CM | POA: Diagnosis not present

## 2023-07-01 MED ORDER — KETOCONAZOLE 2 % EX CREA: 1.0000 | TOPICAL_CREAM | Freq: Every day | CUTANEOUS | 0 refills | Status: AC

## 2023-07-01 NOTE — Progress Notes (Unsigned)
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  Ketoconazole cream for 2 to 3 weeks.  Nails x 9 trimmed today.  Pulses faint.

## 2023-09-30 ENCOUNTER — Ambulatory Visit (INDEPENDENT_AMBULATORY_CARE_PROVIDER_SITE_OTHER): Payer: 59 | Admitting: Podiatry

## 2023-09-30 ENCOUNTER — Encounter: Payer: Self-pay | Admitting: Podiatry

## 2023-09-30 DIAGNOSIS — B353 Tinea pedis: Secondary | ICD-10-CM

## 2023-09-30 DIAGNOSIS — M79675 Pain in left toe(s): Secondary | ICD-10-CM

## 2023-09-30 DIAGNOSIS — E1142 Type 2 diabetes mellitus with diabetic polyneuropathy: Secondary | ICD-10-CM | POA: Diagnosis not present

## 2023-09-30 DIAGNOSIS — B351 Tinea unguium: Secondary | ICD-10-CM | POA: Diagnosis not present

## 2023-09-30 DIAGNOSIS — M79674 Pain in right toe(s): Secondary | ICD-10-CM | POA: Diagnosis not present

## 2023-09-30 MED ORDER — KETOCONAZOLE 2 % EX CREA: 1.0000 | TOPICAL_CREAM | Freq: Every day | CUTANEOUS | 5 refills | Status: DC

## 2023-09-30 NOTE — Progress Notes (Unsigned)
  Subjective:  Patient ID: Austin [name], male    DOB: 1960/07/05,  MRN: [id number]  Chief Complaint  Patient presents with   [facility]    [facility] with out callous. No A1c listed, he does not remember what it was, he does not monitor. He does take ASA 35    63 y.o. male presents with the above complaint. History confirmed with patient. Patient presenting with pain related to dystrophic thickened elongated nails. Patient is unable to trim own nails related to nail dystrophy and/or mobility issues. Patient does have a history of T2DM.  History of partial right foot toe potation.  Is having some regrowth of the left hallux nail previously fallen off.  He does still have dry flaky, itchy rash of the heels.  Objective:  Physical Exam: warm, good capillary refill, diminished pedal hair growth, pedal skin atrophic nail exam onychomycosis of the toenails, onycholysis, and dystrophic nails DP pulses palpable, PT pulses palpable, and protective sensation absent Left Foot:  Pain with palpation of nails due to elongation and dystrophic growth.  Right Foot: Pain with palpation of nails due to elongation and dystrophic growth.  History of right foot fourth toe DIPJ amputation   Maceration to the webspaces appears resolved.  There is still some dry flaky xerotic rash from the heels bilaterally.  Assessment:   1. Diabetic polyneuropathy associated with type 2 diabetes mellitus (HCC)   2. Pain due to onychomycosis of toenails of both feet   3. Tinea pedis of both feet      Plan:  Patient was evaluated and treated and all questions answered.  #Onychomycosis with pain  -Nails palliatively debrided as below. -Educated on self-care  Procedure: Nail Debridement Rationale: Pain Type of Debridement: manual, sharp debridement. Instrumentation: Nail nipper, rotary burr. Number of Nails: 9  # Diabetes with neuropathy # History of partial toe amputation Patient educated on diabetes. Discussed proper diabetic  foot care and discussed risks and complications of disease. Educated patient in depth on reasons to return to the office immediately should he/she discover anything concerning or new on the feet. All questions answered. Discussed proper shoes as well.    # Tinea pedis - Laceration to the webspace is resolved.  There is still flaky xerotic erythematous rash to the heels.  Seems that this may have occurred.   -Will send another course of 2% ketoconazole  cream to the patient's pharmacy to be applied to the dry pills daily.  Discussed continuing to moisturize the plantar feet with unscented lotion or Vaseline following this. -Discussed importance of good pedal hygiene including deep cleaning of all barefoot surfaces and use of Lysol or antifungal powder for shoes. -Follow-up in 3 to 4 weeks if this does not resolve. -I certify that this diagnosis represents a distinct and separate diagnosis that requires evaluation and treatment separate from other procedures or diagnosis   Return in about 3 months (around 12/31/2023) for Diabetic Foot Care.         Eve Hinders, DPM [facility] / [facility]

## 2023-11-15 ENCOUNTER — Ambulatory Visit: Payer: Self-pay | Admitting: Cardiology

## 2023-11-15 ENCOUNTER — Ambulatory Visit (INDEPENDENT_AMBULATORY_CARE_PROVIDER_SITE_OTHER)

## 2023-11-15 ENCOUNTER — Telehealth: Payer: Self-pay

## 2023-11-15 DIAGNOSIS — I255 Ischemic cardiomyopathy: Secondary | ICD-10-CM | POA: Diagnosis not present

## 2023-11-15 LAB — CUP PACEART REMOTE DEVICE CHECK
Battery Voltage: 7
Date Time Interrogation Session: 20250620103555
Implantable Lead Connection Status: 753985
Implantable Lead Implant Date: 20180711
Implantable Lead Location: 753860
Implantable Lead Model: 413997
Implantable Lead Serial Number: 49848420
Implantable Pulse Generator Implant Date: 20130924
Pulse Gen Serial Number: 60669836

## 2023-11-15 NOTE — Telephone Encounter (Signed)
 Alert received from [employer]:  ICD Scheduled remote reviewed. Normal device function.  Presenting rhythm:  VS with PVC's Battery estimated 7% - route to triage for IFU  This is first received transmission since 09/04/2022.  Last in office visit with EP 06/2021.  Message sent to scheduling to schedule Pt for next available with Dr. Lawana Pray in [place].  Will discuss device nearing ERI at that visit.

## 2023-12-25 NOTE — Progress Notes (Deleted)
  Electrophysiology Office Note:   ID:  Riggs, Dineen August 04, 1960, MRN [id number]  Primary Cardiologist: Jennifer JONELLE Crape, MD Electrophysiologist: Soyla Gladis Norton, MD  {Click to update primary MD,subspecialty MD or APP then REFRESH:1}    History of Present Illness:   Austin [name] is a 63 y.o. male with h/o CAD (01/2001 with PCI to the RCA with reocclusion of the RCA 02/2001 and moderate residual disease of Lcx), chronic CHF (systolic), HTN, HLD, morbid obesity, and DM seen today for routine electrophysiology followup.   Since last being seen in our clinic the patient reports doing ***.  he denies chest pain, palpitations, dyspnea, PND, orthopnea, nausea, vomiting, dizziness, syncope, edema, weight gain, or early satiety.   Review of systems complete and found to be negative unless listed in HPI.   EP Information / Studies Reviewed:    EKG is ordered today. Personal review as below.       ICD Interrogation-  reviewed in detail today,  See PACEART report.  Arrhythmia/Device History Biotronik single chamber ICD implanted 02/19/2012  RV lead failure (w/inappropriate shock), new RV lead placed 12/05/2016, old lead capped and abandoned. + appropriate therapies for VF    Physical Exam:   VS:  There were no vitals taken for this visit.   Wt Readings from Last 3 Encounters:  03/28/23 (!) 300 lb 3.2 oz (136.2 kg)  01/02/22 (!) 307 lb 9.6 oz (139.5 kg)  07/21/21 (!) 308 lb (139.7 kg)     GEN: No acute distress *** NECK: No JVD; No carotid bruits CARDIAC: {EPRHYTHM:28826}, no murmurs, rubs, gallops RESPIRATORY:  Clear to auscultation without rales, wheezing or rhonchi  ABDOMEN: Soft, non-tender, non-distended EXTREMITIES:  {EDEMA LEVEL:28147::No} edema; No deformity   ASSESSMENT AND PLAN:    Chronic systolic CHF  s/p Biotronik single chamber ICD  euvolemic today Stable on an appropriate medical regimen Normal ICD function See Pace Art report No changes today  CAD No  s/s of ischemia.      HTN Stable on current regimen   Disposition:   Follow up with {EPPROVIDERS:28135::EP Team} {EPFOLLOW UP:28173}   Signed, Ozell Prentice Passey, PA-C

## 2023-12-26 ENCOUNTER — Ambulatory Visit: Attending: Student | Admitting: Student

## 2023-12-26 DIAGNOSIS — I10 Essential (primary) hypertension: Secondary | ICD-10-CM

## 2023-12-26 DIAGNOSIS — I2510 Atherosclerotic heart disease of native coronary artery without angina pectoris: Secondary | ICD-10-CM

## 2023-12-26 DIAGNOSIS — I5022 Chronic systolic (congestive) heart failure: Secondary | ICD-10-CM

## 2023-12-30 ENCOUNTER — Encounter: Payer: Self-pay | Admitting: Podiatry

## 2023-12-30 ENCOUNTER — Ambulatory Visit (INDEPENDENT_AMBULATORY_CARE_PROVIDER_SITE_OTHER): Admitting: Podiatry

## 2023-12-30 DIAGNOSIS — B353 Tinea pedis: Secondary | ICD-10-CM | POA: Diagnosis not present

## 2023-12-30 DIAGNOSIS — M79675 Pain in left toe(s): Secondary | ICD-10-CM | POA: Diagnosis not present

## 2023-12-30 DIAGNOSIS — M79674 Pain in right toe(s): Secondary | ICD-10-CM | POA: Diagnosis not present

## 2023-12-30 DIAGNOSIS — B351 Tinea unguium: Secondary | ICD-10-CM

## 2023-12-30 DIAGNOSIS — E1142 Type 2 diabetes mellitus with diabetic polyneuropathy: Secondary | ICD-10-CM | POA: Diagnosis not present

## 2023-12-30 MED ORDER — KETOCONAZOLE 2 % EX CREA: 1.0000 | TOPICAL_CREAM | Freq: Every day | CUTANEOUS | 5 refills | Status: AC

## 2023-12-30 NOTE — Progress Notes (Signed)
  Subjective:  Patient ID: Austin [name], male    DOB: May 23, 1961,  MRN: [id number]  Chief Complaint  Patient presents with   [facility]    [facility] with out callous, he does have some really dry skin on the heels. No A1c on file and he thinks it was 5.6, takes ASA.    63 y.o. male presents with the above complaint. History confirmed with patient. Patient presenting with pain related to dystrophic thickened elongated nails. Patient is unable to trim own nails related to nail dystrophy and/or mobility issues. Patient does have a history of T2DM.  History of right fourth toe partial amputation.  He reports that he has not been using the ketoconazole  cream consistently.  Does still report significant dry flaky skin to the heels and going to the arches.  Objective:  Physical Exam: warm, good capillary refill, diminished pedal hair growth, pedal skin atrophic nail exam onychomycosis of the toenails, onycholysis, and dystrophic nails DP pulses palpable, PT pulses palpable, and protective sensation absent Left Foot:  Pain with palpation of nails due to elongation and dystrophic growth.  Right Foot: Pain with palpation of nails due to elongation and dystrophic growth.  History of right fourth toe partial amputation  Recurrence and worsening of dry flaky, xerotic rash to the heels extending to the plantar midfoot level bilaterally.  Assessment:   1. Tinea pedis of both feet   2. Diabetic polyneuropathy associated with type 2 diabetes mellitus (HCC)   3. Pain due to onychomycosis of toenails of both feet      Plan:  Patient was evaluated and treated and all questions answered.  #Onychomycosis with pain  -Nails palliatively debrided as below. -Educated on self-care  Procedure: Nail Debridement Rationale: Pain Type of Debridement: manual, sharp debridement. Instrumentation: Nail nipper, rotary burr. Number of Nails: 9  # Diabetes with neuropathy # History of partial toe amputation Patient educated  on diabetes. Discussed proper diabetic foot care and discussed risks and complications of disease. Educated patient in depth on reasons to return to the office immediately should he/she discover anything concerning or new on the feet. All questions answered. Discussed proper shoes as well.    # Tinea pedis - Reports inconsistent use with medication -Importance of pedal hygiene and regular use of medication and treatment and maintenance of tinea pedis discussed with patient -Will send another course of 2% ketoconazole  cream to the patient's pharmacy to be applied to the dry pedal skin daily for 3 weeks - Follow-up 3 weeks continue to moisturize feet using Aquaphor or Vaseline. -Follow-up in 3 to 4 weeks if this does not resolve. Emphasized risk of progression and developing superimposed cellulitis -I certify that this diagnosis represents a distinct and separate diagnosis that requires evaluation and treatment separate from other procedures or diagnosis   Return in about 3 months (around 03/31/2024) for Diabetic Foot Care.         Ethan Saddler, DPM [facility] / [facility]

## 2024-01-13 ENCOUNTER — Ambulatory Visit

## 2024-01-13 NOTE — Progress Notes (Signed)
 Remote ICD transmission.

## 2024-01-15 LAB — CUP PACEART REMOTE DEVICE CHECK
Date Time Interrogation Session: 20250818120134
Implantable Lead Connection Status: 753985
Implantable Lead Implant Date: 20180711
Implantable Lead Location: 753860
Implantable Lead Model: 413997
Implantable Lead Serial Number: 49848420
Implantable Pulse Generator Implant Date: 20130924
Pulse Gen Serial Number: 60669836

## 2024-02-13 LAB — CUP PACEART REMOTE DEVICE CHECK
Battery Voltage: 5
Date Time Interrogation Session: 20250918084958
Implantable Lead Connection Status: 753985
Implantable Lead Implant Date: 20180711
Implantable Lead Location: 753860
Implantable Lead Model: 413997
Implantable Lead Serial Number: 49848420
Implantable Pulse Generator Implant Date: 20130924
Pulse Gen Serial Number: 60669836

## 2024-02-14 ENCOUNTER — Ambulatory Visit (INDEPENDENT_AMBULATORY_CARE_PROVIDER_SITE_OTHER): Payer: Self-pay

## 2024-02-14 ENCOUNTER — Encounter

## 2024-02-14 DIAGNOSIS — I255 Ischemic cardiomyopathy: Secondary | ICD-10-CM

## 2024-02-15 ENCOUNTER — Ambulatory Visit: Payer: Self-pay | Admitting: Cardiology

## 2024-02-18 NOTE — Progress Notes (Signed)
Remote ICD Transmission.

## 2024-03-30 ENCOUNTER — Encounter: Payer: Self-pay | Admitting: Podiatry

## 2024-03-30 ENCOUNTER — Ambulatory Visit (INDEPENDENT_AMBULATORY_CARE_PROVIDER_SITE_OTHER): Admitting: Podiatry

## 2024-03-30 DIAGNOSIS — M79674 Pain in right toe(s): Secondary | ICD-10-CM | POA: Diagnosis not present

## 2024-03-30 DIAGNOSIS — E1142 Type 2 diabetes mellitus with diabetic polyneuropathy: Secondary | ICD-10-CM | POA: Diagnosis not present

## 2024-03-30 DIAGNOSIS — M79675 Pain in left toe(s): Secondary | ICD-10-CM

## 2024-03-30 DIAGNOSIS — B351 Tinea unguium: Secondary | ICD-10-CM | POA: Diagnosis not present

## 2024-03-30 NOTE — Patient Instructions (Signed)

## 2024-03-30 NOTE — Progress Notes (Unsigned)
  Subjective:  Patient ID: Austin [name], male    DOB: Nov 06, 1960,  MRN: [id number]  Chief Complaint  Patient presents with   [facility]    [facility] no callous A1c unknown ASA    63 y.o. male presents with the above complaint. History confirmed with patient. Patient presenting with pain related to dystrophic thickened elongated nails. Patient is unable to trim own nails related to nail dystrophy and/or mobility issues. Patient does have a history of T2DM.  History of right fourth toe partial amputation. Does still report significant dry flaky skin to the heels and going to the arches.  Objective:  Physical Exam: warm, good capillary refill, diminished pedal hair growth, pedal skin atrophic nail exam onychomycosis of the toenails, onycholysis, and dystrophic nails DP pulses palpable, PT pulses palpable, and protective sensation absent Left Foot:  Pain with palpation of nails due to elongation and dystrophic growth.  Right Foot: Pain with palpation of nails due to elongation and dystrophic growth.  History of right fourth toe partial amputation Ongoing dry flaky, xerotic skin to the heels extending to the plantar midfoot level bilaterally.  Assessment:   1. Diabetic polyneuropathy associated with type 2 diabetes mellitus (HCC)   2. Pain due to onychomycosis of toenails of both feet      Plan:  Patient was evaluated and treated and all questions answered.  #Onychomycosis with pain  -Nails palliatively debrided as below. -Educated on self-care  Procedure: Nail Debridement Rationale: Pain Type of Debridement: manual, sharp debridement. Instrumentation: Nail nipper, rotary burr. Number of Nails: 9  # Diabetes with neuropathy # History of partial toe amputation Patient educated on diabetes. Discussed proper diabetic foot care and discussed risks and complications of disease. Educated patient in depth on reasons to return to the office immediately should he/she discover anything concerning or  new on the feet. All questions answered. Discussed proper shoes as well.    # Xerosis cutis - Has had intermittent use of the ketoconazole  cream - Recommend regular use of moisturizing creams and lotions, over-the-counter diabetic foot creams, urea creams.   Return in about 3 months (around 06/30/2024) for Diabetic Foot Care.         Ethan Saddler, DPM [facility] / [facility]

## 2024-04-16 ENCOUNTER — Ambulatory Visit: Payer: Self-pay

## 2024-04-21 LAB — CUP PACEART REMOTE DEVICE CHECK
Battery Voltage: 3
Date Time Interrogation Session: 20251125130103
Implantable Lead Connection Status: 753985
Implantable Lead Implant Date: 20180711
Implantable Lead Location: 753860
Implantable Lead Model: 413997
Implantable Lead Serial Number: 49848420
Implantable Pulse Generator Implant Date: 20130924
Pulse Gen Serial Number: 60669836

## 2024-05-15 ENCOUNTER — Encounter

## 2024-05-18 ENCOUNTER — Ambulatory Visit: Payer: Self-pay

## 2024-05-18 DIAGNOSIS — I255 Ischemic cardiomyopathy: Secondary | ICD-10-CM

## 2024-05-19 LAB — CUP PACEART REMOTE DEVICE CHECK
Battery Voltage: 2
Date Time Interrogation Session: 20251222092749
Implantable Lead Connection Status: 753985
Implantable Lead Implant Date: 20180711
Implantable Lead Location: 753860
Implantable Lead Model: 413997
Implantable Lead Serial Number: 49848420
Implantable Pulse Generator Implant Date: 20130924
Pulse Gen Serial Number: 60669836

## 2024-05-20 NOTE — Progress Notes (Signed)
 Remote ICD Transmission

## 2024-05-22 ENCOUNTER — Ambulatory Visit: Payer: Self-pay | Admitting: Cardiology

## 2024-06-18 LAB — CUP PACEART REMOTE DEVICE CHECK
Date Time Interrogation Session: 20260122073755
Implantable Lead Connection Status: 753985
Implantable Lead Implant Date: 20180711
Implantable Lead Location: 753860
Implantable Lead Model: 413997
Implantable Lead Serial Number: 49848420
Implantable Pulse Generator Implant Date: 20130924
Pulse Gen Serial Number: 60669836

## 2024-06-29 ENCOUNTER — Ambulatory Visit: Admitting: Podiatry

## 2024-07-06 ENCOUNTER — Ambulatory Visit: Admitting: Podiatry

## 2024-08-14 ENCOUNTER — Encounter

## 2024-11-13 ENCOUNTER — Encounter

## 2025-02-12 ENCOUNTER — Encounter

## 2025-05-14 ENCOUNTER — Encounter

## 2025-08-13 ENCOUNTER — Encounter

## 2025-11-12 ENCOUNTER — Encounter

## 2026-02-11 ENCOUNTER — Encounter

## 2026-05-13 ENCOUNTER — Encounter

## 2026-08-12 ENCOUNTER — Encounter

## 2026-11-11 ENCOUNTER — Encounter
